# Patient Record
Sex: Male | Born: 1980 | Race: Black or African American | Hispanic: No | Marital: Married | Smoking: Never smoker
Health system: Southern US, Community
[De-identification: ages and names within clinical notes are randomized; demographics above are authoritative.]

---

## 2006-09-18 ENCOUNTER — Emergency Department (HOSPITAL_COMMUNITY): Admission: EM | Admit: 2006-09-18 | Discharge: 2006-09-18 | Payer: Self-pay | Admitting: Emergency Medicine

## 2010-10-15 ENCOUNTER — Inpatient Hospital Stay (INDEPENDENT_AMBULATORY_CARE_PROVIDER_SITE_OTHER)
Admission: RE | Admit: 2010-10-15 | Discharge: 2010-10-15 | Disposition: A | Discharge status: Home / Self Care | Payer: Self-pay | Source: Ambulatory Visit | Attending: Emergency Medicine | Admitting: Emergency Medicine

## 2010-10-15 DIAGNOSIS — L539 Erythematous condition, unspecified: Secondary | ICD-10-CM

## 2010-10-15 DIAGNOSIS — L819 Disorder of pigmentation, unspecified: Secondary | ICD-10-CM

## 2010-10-15 LAB — POCT I-STAT, CHEM 8
BUN: 7 mg/dL (ref 6–23)
Chloride: 103 mEq/L (ref 96–112)
Glucose, Bld: 100 mg/dL — ABNORMAL HIGH (ref 70–99)
HCT: 49 % (ref 39.0–52.0)
Potassium: 4.3 mEq/L (ref 3.5–5.1)

## 2010-10-15 LAB — CBC
HCT: 44.4 % (ref 39.0–52.0)
Hemoglobin: 15.2 g/dL (ref 13.0–17.0)
MCHC: 34.2 g/dL (ref 30.0–36.0)
WBC: 7.2 10*3/uL (ref 4.0–10.5)

## 2010-10-15 LAB — SEDIMENTATION RATE: Sed Rate: 3 mm/hr (ref 0–16)

## 2010-12-08 ENCOUNTER — Ambulatory Visit: Payer: Self-pay | Admitting: Family Medicine

## 2010-12-09 ENCOUNTER — Encounter: Payer: Self-pay | Admitting: Family Medicine

## 2010-12-09 ENCOUNTER — Ambulatory Visit (INDEPENDENT_AMBULATORY_CARE_PROVIDER_SITE_OTHER): Payer: 59 | Admitting: Family Medicine

## 2010-12-09 ENCOUNTER — Ambulatory Visit (INDEPENDENT_AMBULATORY_CARE_PROVIDER_SITE_OTHER)
Admission: RE | Admit: 2010-12-09 | Discharge: 2010-12-09 | Disposition: A | Discharge status: Home / Self Care | Payer: 59 | Source: Ambulatory Visit | Attending: Family Medicine | Admitting: Family Medicine

## 2010-12-09 VITALS — BP 110/72 | HR 72 | Temp 98.0°F | Ht 77.0 in | Wt 231.5 lb

## 2010-12-09 DIAGNOSIS — Z0000 Encounter for general adult medical examination without abnormal findings: Secondary | ICD-10-CM

## 2010-12-09 DIAGNOSIS — M545 Low back pain, unspecified: Secondary | ICD-10-CM

## 2010-12-09 DIAGNOSIS — Z136 Encounter for screening for cardiovascular disorders: Secondary | ICD-10-CM

## 2010-12-09 LAB — BASIC METABOLIC PANEL
CO2: 30 mEq/L (ref 19–32)
Chloride: 105 mEq/L (ref 96–112)
Sodium: 141 mEq/L (ref 135–145)

## 2010-12-09 LAB — LIPID PANEL
HDL: 42.4 mg/dL (ref >39.00)
LDL Cholesterol: 117 mg/dL — ABNORMAL HIGH (ref 0–99)
Total CHOL/HDL Ratio: 5
Triglycerides: 187 mg/dL — ABNORMAL HIGH (ref 0.0–149.0)

## 2010-12-09 NOTE — Patient Instructions (Signed)
Wonderful to meet you. Have a great Thanksgiving and trip to [place]. We will call you with your xray and lab results.

## 2010-12-09 NOTE — Progress Notes (Signed)
  Subjective:    Patient ID: Ian Ortega, male    DOB: [birth year], 30 y.o.   MRN: [id number]  HPI Very pleasant 30 yo male here to establish care and for CPX.  Very healthy, no known family h/o DM, HTN or Cancer.  Right lower back pain- On and off for several months. No LE weakness or radiculopathy. Was wrestling with someone larger than him, thought he felt something pop--since then, intermittent back aches.  Nothing seems to make it better or worse.  Patient Active Problem List  Diagnoses  . Right lumbar pain  . Routine general medical examination at a health care facility   No past medical history on file. No past surgical history on file. History  Substance Use Topics  . Smoking status: Never Smoker   . Smokeless tobacco: Not on file  . Alcohol Use: Not on file   No family history on file. No Known Allergies No current outpatient prescriptions on file prior to visit.   The PMH, PSH, Social History, Family History, Medications, and allergies have been reviewed in [facility], and have been updated if relevant.    Review of Systems See HPI Patient reports no  vision/ hearing changes,anorexia, weight change, fever ,adenopathy, persistant / recurrent hoarseness, swallowing issues, chest pain, edema,persistant / recurrent cough, hemoptysis, dyspnea(rest, exertional, paroxysmal nocturnal), gastrointestinal  bleeding (melena, rectal bleeding), abdominal pain, excessive heart burn, GU symptoms(dysuria, hematuria, pyuria, voiding/incontinence  Issues) syncope, focal weakness, severe memory loss, concerning skin lesions, depression, anxiety, abnormal bruising/bleeding, major joint swelling.       Objective:   Physical Exam BP 110/72  Pulse 72  Temp(Src) 98 F (36.7 C) (Oral)  Ht 6\' 5"  (1.956 m)  Wt 231 lb 8 oz (105.008 kg)  BMI 27.45 kg/m2 General:  plesasant male in NAD Eyes:  PERRL Ears:  External ear exam shows no significant lesions or deformities.  Otoscopic examination  reveals clear canals, tympanic membranes are intact bilaterally without bulging, retraction, inflammation or discharge. Hearing is grossly normal bilaterally. Nose:  External nasal examination shows no deformity or inflammation. Nasal mucosa are pink and moist without lesions or exudates. Mouth:  Oral mucosa and oropharynx without lesions or exudates.  Teeth in good repair. Neck:  no carotid bruit or thyromegaly no cervical or supraclavicular lymphadenopathy  Lungs:  Normal respiratory effort, chest expands symmetrically. Lungs are clear to auscultation, no crackles or wheezes. Heart:  Normal rate and regular rhythm. S1 and S2 normal without gallop, murmur, click, rub or other extra sounds. Abdomen:  Bowel sounds positive,abdomen soft and non-tender without masses, organomegaly or hernias noted. Msk:  +curvature to spine, abnormal alignment, SLR neg bilaterally, non tender to palpation over spine, FROM, normal gait. Pulses:  R and L posterior tibial pulses are full and equal bilaterally  Extremities:  no edema      Assessment & Plan:   1. Right lumbar pain  New- ? Abnormal alignment/scoliosis. Will get xrays today, likely will need ortho referral. DG Lumbar Spine Complete W/Bend  2. Routine general medical examination at a health care facility  Reviewed preventive care protocols, scheduled due services, and updated immunizations Discussed nutrition, exercise, diet, and healthy lifestyle.  Basic Metabolic Panel (BMET)  3. Screening for ischemic heart disease  Lipid Profile

## 2010-12-15 ENCOUNTER — Encounter: Payer: Self-pay | Admitting: *Deleted

## 2010-12-15 ENCOUNTER — Other Ambulatory Visit: Payer: Self-pay | Admitting: Family Medicine

## 2010-12-15 DIAGNOSIS — M545 Low back pain, unspecified: Secondary | ICD-10-CM

## 2012-04-27 ENCOUNTER — Encounter: Payer: Self-pay | Admitting: Family Medicine

## 2012-04-27 ENCOUNTER — Ambulatory Visit (INDEPENDENT_AMBULATORY_CARE_PROVIDER_SITE_OTHER): Payer: BC Managed Care – PPO | Admitting: Family Medicine

## 2012-04-27 VITALS — BP 126/82 | HR 66 | Temp 98.0°F | Ht 77.0 in | Wt 238.8 lb

## 2012-04-27 DIAGNOSIS — Z0000 Encounter for general adult medical examination without abnormal findings: Secondary | ICD-10-CM

## 2012-04-27 DIAGNOSIS — R0609 Other forms of dyspnea: Secondary | ICD-10-CM

## 2012-04-27 DIAGNOSIS — R0989 Other specified symptoms and signs involving the circulatory and respiratory systems: Secondary | ICD-10-CM

## 2012-04-27 DIAGNOSIS — R0683 Snoring: Secondary | ICD-10-CM

## 2012-04-27 DIAGNOSIS — Z136 Encounter for screening for cardiovascular disorders: Secondary | ICD-10-CM

## 2012-04-27 LAB — COMPREHENSIVE METABOLIC PANEL
ALT: 20 U/L (ref 0–53)
AST: 20 U/L (ref 0–37)
Creatinine, Ser: 0.9 mg/dL (ref 0.4–1.5)
GFR: 132.62 mL/min (ref >60.00)
Total Bilirubin: 0.5 mg/dL (ref 0.3–1.2)

## 2012-04-27 LAB — LIPID PANEL
HDL: 31.7 mg/dL — ABNORMAL LOW (ref >39.00)
VLDL: 65 mg/dL — ABNORMAL HIGH (ref 0.0–40.0)

## 2012-04-27 LAB — LDL CHOLESTEROL, DIRECT: Direct LDL: 135.4 mg/dL

## 2012-04-27 NOTE — Patient Instructions (Addendum)
We will call you with your lab results. After you go to the lab, please stop by to see Shirlee Limerick to set up your appointment.

## 2012-04-27 NOTE — Progress Notes (Signed)
Very pleasant 32 yo health male here for CPX.  Very healthy, no known family h/o DM, HTN or Cancer.   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 Does not feel more fatigued that usual.  No CP, SOB.  Patient Active Problem List  Diagnosis  . Routine general medical examination at a health care facility   No past medical history on file. No past surgical history on file. History  Substance Use Topics  . Smoking status: Never Smoker   . Smokeless tobacco: Not on file  . Alcohol Use: Yes     Comment: occasional   No family history on file. No Known Allergies No current outpatient prescriptions on file prior to visit.   No current facility-administered medications on file prior to visit.   The PMH, PSH, Social History, Family History, Medications, and allergies have been reviewed in [facility], and have been updated if relevant.  Review of Systems  See HPI  Patient reports no vision/ hearing changes,anorexia, weight change, fever ,adenopathy, persistant / recurrent hoarseness, swallowing issues, chest pain, edema,persistant / recurrent cough, hemoptysis, dyspnea(rest, exertional, paroxysmal nocturnal), gastrointestinal bleeding (melena, rectal bleeding), abdominal pain, excessive heart burn, GU symptoms(dysuria, hematuria, pyuria, voiding/incontinence Issues) syncope, focal weakness, severe memory loss, concerning skin lesions, depression, anxiety, abnormal bruising/bleeding, major joint swelling.  Objective:   Physical Exam  BP 126/82  Pulse 66  Temp(Src) 98 F (36.7 C) (Oral)  Ht 6\' 5"  (1.956 m)  Wt 238 lb 12 oz (108.296 kg)  BMI 28.31 kg/m2  SpO2 98%  General: plesasant male in NAD  Eyes: PERRL  Ears: External ear exam shows no significant lesions or deformities. Otoscopic examination reveals clear canals, tympanic membranes are intact bilaterally without  bulging, retraction, inflammation or discharge. Hearing is grossly normal bilaterally.  Nose: External nasal examination shows no deformity or inflammation. Nasal mucosa are pink and moist without lesions or exudates.  Mouth: Oral mucosa and oropharynx without lesions or exudates. Teeth in good repair.  Neck: no carotid bruit or thyromegaly no cervical or supraclavicular lymphadenopathy  Lungs: Normal respiratory effort, chest expands symmetrically. Lungs are clear to auscultation, no crackles or wheezes.  Heart: Normal rate and regular rhythm. S1 and S2 normal without gallop, murmur, click, rub or other extra sounds.  Abdomen: Bowel sounds positive,abdomen soft and non-tender without masses, organomegaly or hernias noted.  Pulses: R and L posterior tibial pulses are full and equal bilaterally  Extremities: no edema   Assessment and Plan: 1. Routine general medical examination at a health care facility Reviewed preventive care protocols, scheduled due services, and updated immunizations Discussed nutrition, exercise, diet, and healthy lifestyle.  - Comprehensive metabolic panel  2. Screening for ischemic heart disease  - Lipid Panel  3. Snoring New- refer to pulmonary for sleep study. The patient indicates understanding of these issues and agrees with the plan.  - Ambulatory referral to Pulmonology

## 2012-05-03 ENCOUNTER — Encounter: Payer: Self-pay | Admitting: *Deleted

## 2012-05-27 ENCOUNTER — Ambulatory Visit (INDEPENDENT_AMBULATORY_CARE_PROVIDER_SITE_OTHER): Payer: BC Managed Care – PPO | Admitting: Pulmonary Disease

## 2012-05-27 ENCOUNTER — Encounter: Payer: Self-pay | Admitting: Pulmonary Disease

## 2012-05-27 VITALS — BP 122/76 | HR 77 | Temp 97.7°F | Ht 77.0 in | Wt 245.0 lb

## 2012-05-27 DIAGNOSIS — G4733 Obstructive sleep apnea (adult) (pediatric): Secondary | ICD-10-CM

## 2012-05-27 NOTE — Progress Notes (Signed)
  Subjective:    Patient ID: Ian Ortega, male    DOB: 06/16/1980, 32 y.o.   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 Pt started working 3rd shift in [place]. Spouse notices this about a month ago. Per pt he hasn't noticed this bc he works and has 2 kids and doesn't sleep much.  Epworth sleepiness score is 3/24. Bedtime on a week is at 8:30 AM, he wakes up and down to pick his bed is up from school than naps again for 2 hours late evening before going into work. He sleeps at night on the weekends when he is off. He sleeps on his side with 2 pillows. He denies awakenings during sleep. He is not gained weight in recent years There is no history suggestive of cataplexy, sleep paralysis or parasomnias   No past medical history on file.  No past surgical history on file.   No Known Allergies   History   Social History  . Marital Status: Married    Spouse Name: Rodney Booze    Number of Children: 2  . Years of Education: N/A   Occupational History  .  [employer]   Social History Main Topics  . Smoking status: Never Smoker   . Smokeless tobacco: Not on file  . Alcohol Use: Yes     Comment: occasional  . Drug Use: No  . Sexually Active: Not on file   Other Topics Concern  . Not on file   Social History Narrative   Works at [employer].   Married to [employer].    No family history on file.   Review of Systems  Constitutional: Negative for appetite change and unexpected weight change.  HENT: Negative for ear pain, congestion, sore throat, sneezing, trouble swallowing and dental problem.   Respiratory: Negative for cough.   Cardiovascular: Negative for chest pain, palpitations and leg swelling.  Gastrointestinal: Negative for abdominal pain.  Musculoskeletal: Negative for joint swelling.  Skin: Negative for rash.  Neurological: Negative for headaches.   Psychiatric/Behavioral: Negative for dysphoric mood. The patient is not nervous/anxious.        Objective:   Physical Exam  Gen. Pleasant, well-nourished, tall,  in no distress, normal affect ENT - no lesions, no post nasal drip Neck: No JVD, no thyromegaly, no carotid bruits Lungs: no use of accessory muscles, no dullness to percussion, clear without rales or rhonchi  Cardiovascular: Rhythm regular, heart sounds  normal, no murmurs or gallops, no peripheral edema Abdomen: soft and non-tender, no hepatosplenomegaly, BS normal. Musculoskeletal: No deformities, no cyanosis or clubbing Neuro:  alert, non focal       Assessment & Plan:

## 2012-05-27 NOTE — Assessment & Plan Note (Addendum)
Given excessive daytime somnolence, narrow pharyngeal exam, witnessed apneas & loud snoring, obstructive sleep apnea is very likely & an overnight polysomnogram will be scheduled as a split study. The pathophysiology of obstructive sleep apnea , it's cardiovascular consequences & modes of treatment including CPAP were discused with the patient in detail & they evidenced understanding. He would prefer a home study & he meets criteria since he does not have comorbidities

## 2012-05-27 NOTE — Patient Instructions (Signed)
Sleep study will be scheduled 

## 2012-06-15 ENCOUNTER — Telehealth: Payer: Self-pay | Admitting: Pulmonary Disease

## 2012-06-15 DIAGNOSIS — G4733 Obstructive sleep apnea (adult) (pediatric): Secondary | ICD-10-CM

## 2012-06-15 NOTE — Telephone Encounter (Signed)
Message from our [facility] will send to RA to give okay to place order.

## 2012-06-15 NOTE — Telephone Encounter (Signed)
Home study refused Ordered PSG -daytime

## 2012-06-22 ENCOUNTER — Encounter (HOSPITAL_BASED_OUTPATIENT_CLINIC_OR_DEPARTMENT_OTHER): Payer: BC Managed Care – PPO

## 2012-08-03 ENCOUNTER — Other Ambulatory Visit: Payer: BC Managed Care – PPO

## 2012-09-27 IMAGING — CR DG LUMBAR SPINE COMPLETE W/ BEND
2 series · 2 of 2 positions shown · non-contrast
Comparison: None.

CLINICAL DATA: Back pain.

LUMBAR SPINE - COMPLETE WITH BENDING VIEWS

[view not recorded (1 of 2)]
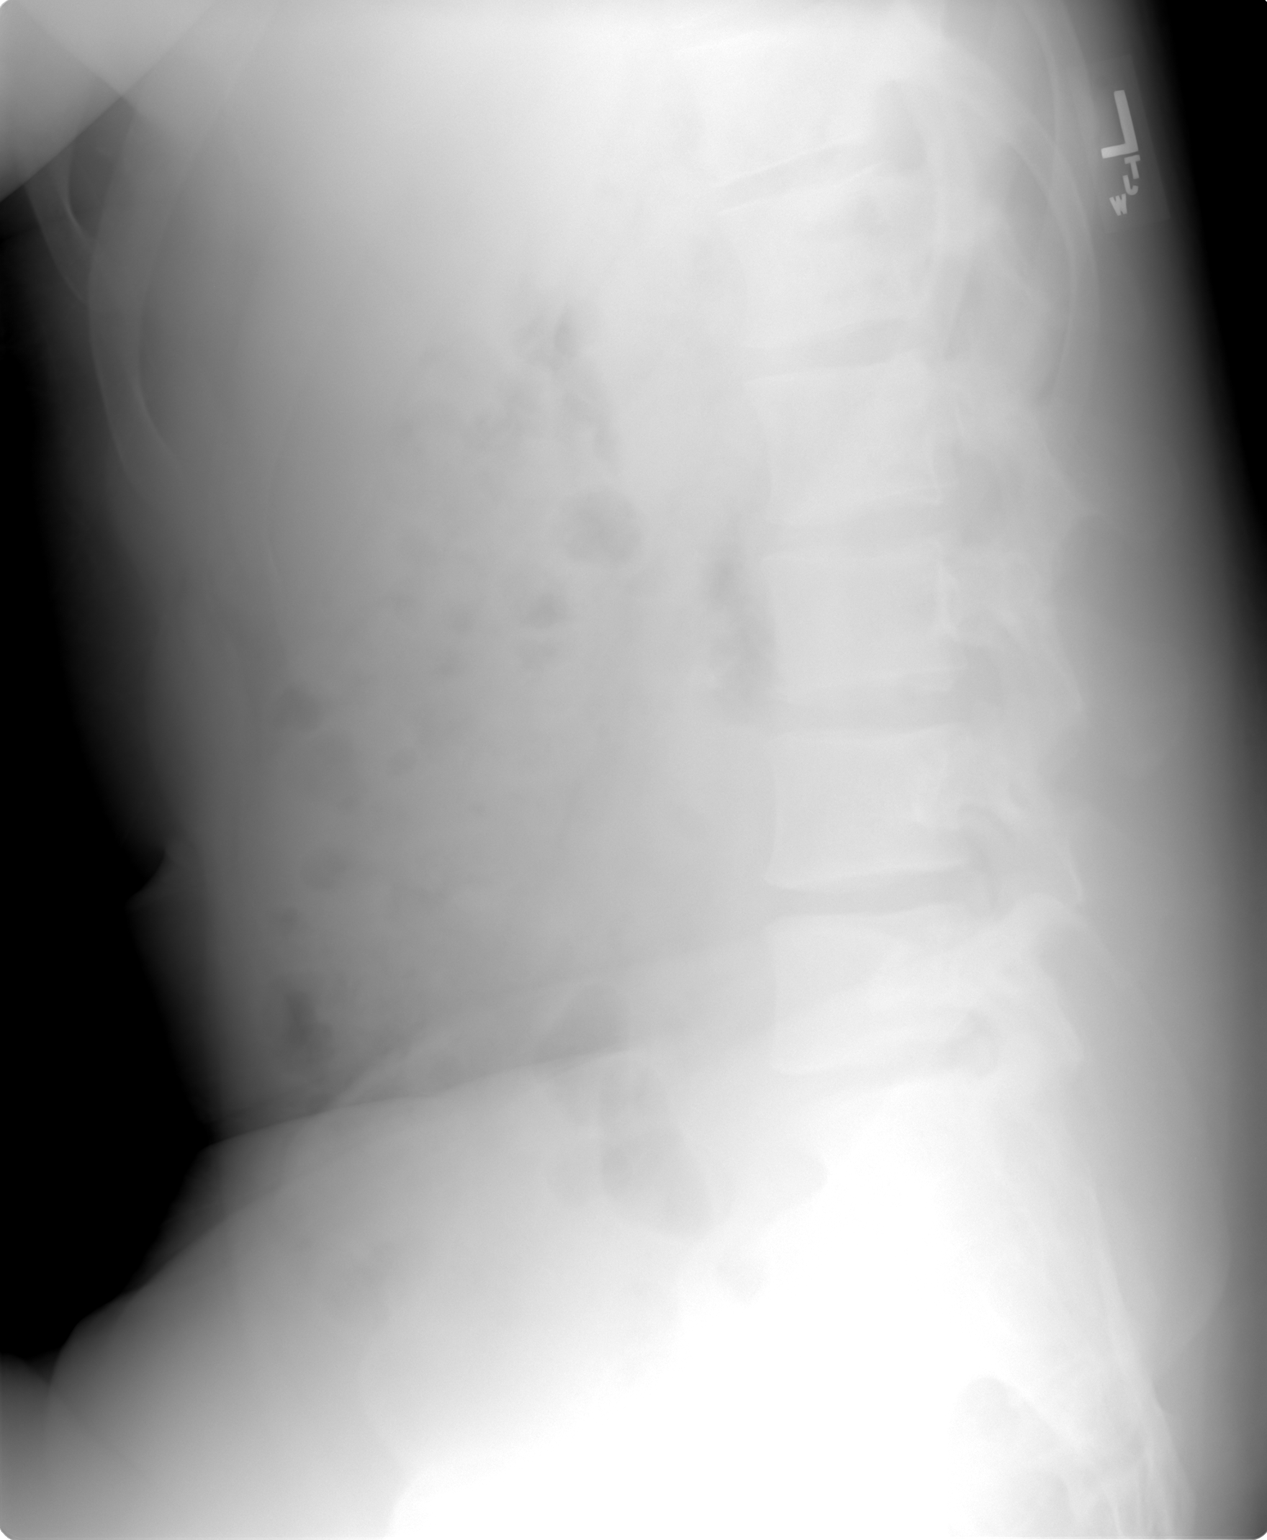

[view not recorded (2 of 2)]
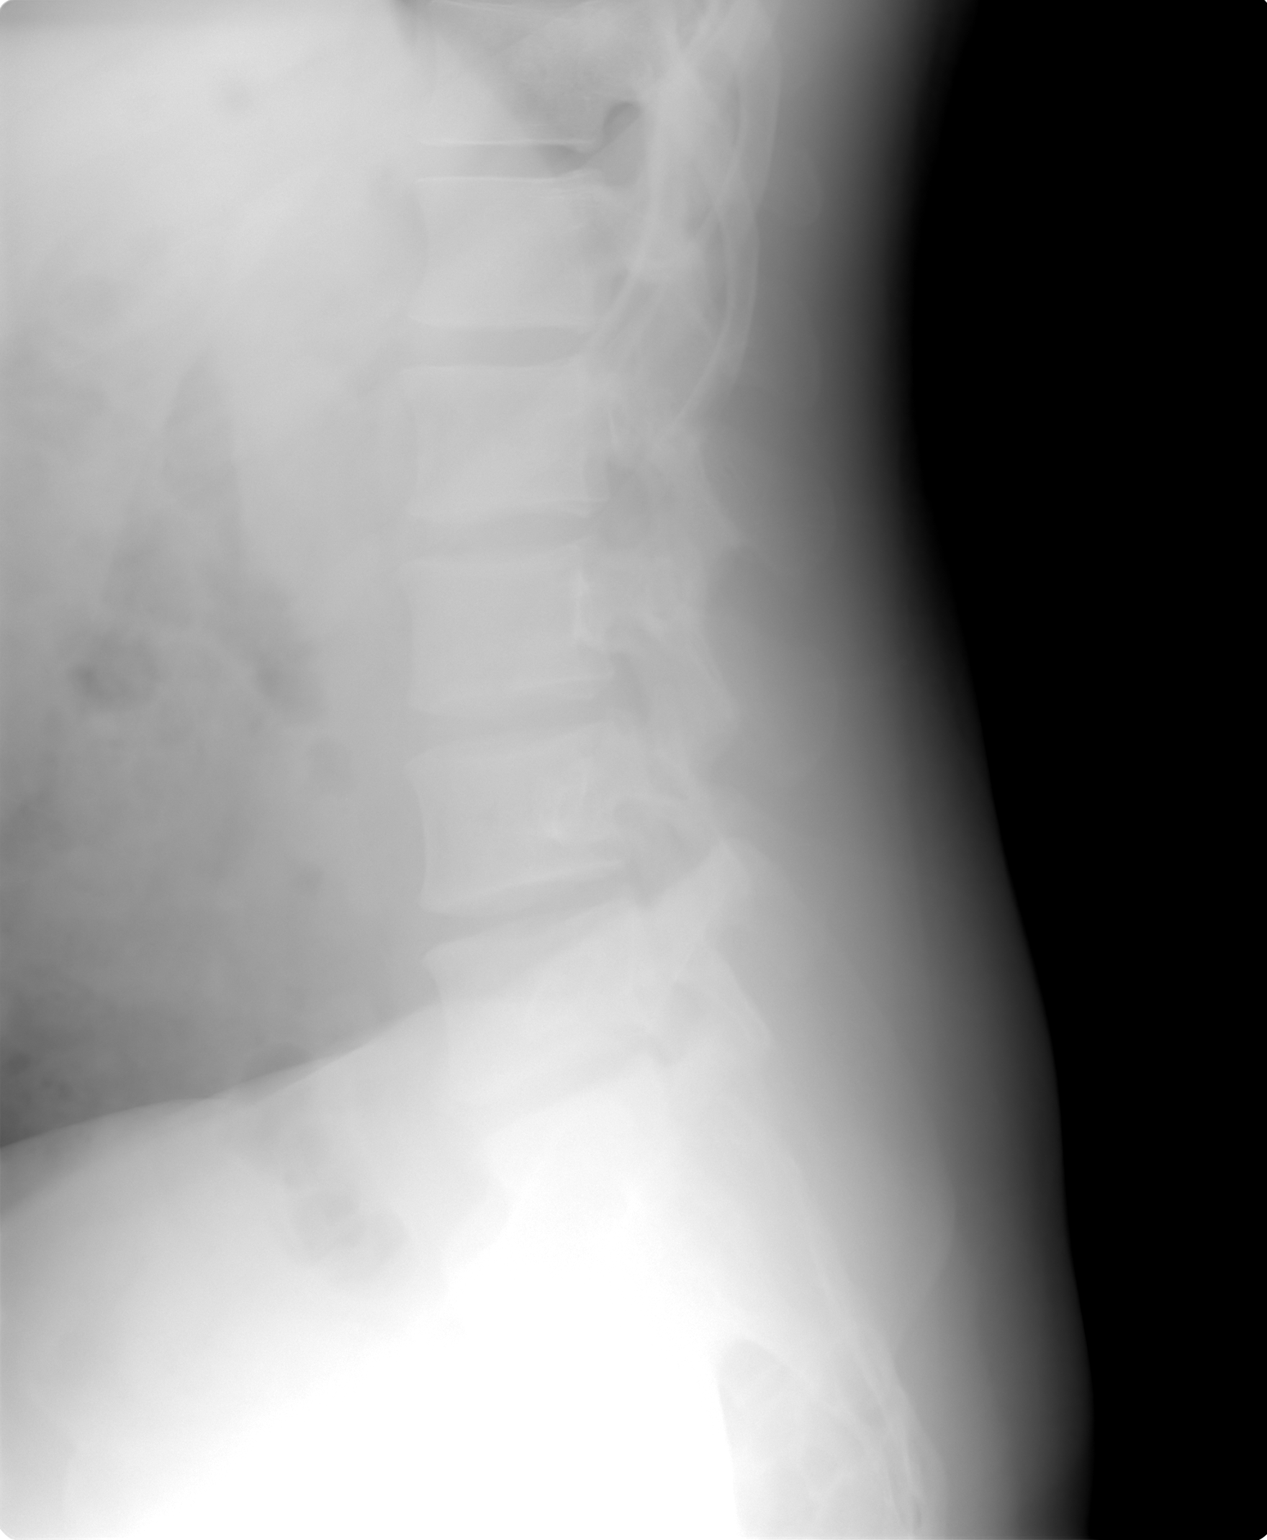

[2 of 2 positions shown; findings below may reference images not displayed]

FINDINGS: Straightening of the lumbar spine.  Mild L4-5 and L5-S1
disc space narrowing.
IMPRESSION: Straightening of the lumbar spine.  Mild L4-5 and L5-S1 disc space
narrowing.

## 2012-11-30 ENCOUNTER — Encounter: Payer: Self-pay | Admitting: Family Medicine

## 2012-11-30 ENCOUNTER — Ambulatory Visit (INDEPENDENT_AMBULATORY_CARE_PROVIDER_SITE_OTHER): Payer: BC Managed Care – PPO | Admitting: Family Medicine

## 2012-11-30 VITALS — BP 124/78 | HR 75 | Temp 97.2°F | Ht 77.0 in | Wt 247.8 lb

## 2012-11-30 DIAGNOSIS — M545 Low back pain: Secondary | ICD-10-CM

## 2012-11-30 NOTE — Progress Notes (Signed)
Pre-visit discussion using our clinic review tool. No additional management support is needed unless otherwise documented below in the visit note.  

## 2012-11-30 NOTE — Progress Notes (Signed)
   Patient Name: Ian Ortega Date of Birth: [birth year] Medical Record Number: [id number] Gender: male  PCP: Ruthe Mannan, MD  History of Present Illness:  Ian Ortega is a 32 y.o. very pleasant male patient who presents with the following: Back Pain  ongoing for approximately: 1 week The patient has had back pain before. The back pain is localized into the lumbar spine area. They also describe no radiculopathy. Was down at [facility], and when he picked him up and felt a sharp pain in the side of his back. And was contorted to his left side.   Years ago, he saw Dr. Jonnie Finner, on lower back.   No numbness or tingling. No bowel or bladder incontinence. No focal weakness. Prior interventions: tylenol Physical therapy: No Chiropractic manipulations: in past Acupuncture: No Osteopathic manipulation: No Heat or cold: heat made it feel better  Past Medical History, Surgical History, Family History, Medications, Allergies have been reviewed and updated if relevant.  Review of Systems  GEN: No fevers, chills. Nontoxic. Primarily MSK c/o today. MSK: Detailed in the HPI GI: tolerating PO intake without difficulty Neuro: As above  Otherwise the pertinent positives of the ROS are noted above.    Physical Exam  Filed Vitals:   11/30/12 0904  BP: 124/78  Pulse: 75  Temp: 97.2 F (36.2 C)  TempSrc: Oral  Height: 6\' 5"  (1.956 m)  Weight: 247 lb 12 oz (112.379 kg)  SpO2: 98%    Gen: Well-developed,well-nourished,in no acute distress; alert,appropriate and cooperative throughout examination HEENT: Normocephalic and atraumatic without obvious abnormalities.  Ears, externally no deformities Pulm: Breathing comfortably in no respiratory distress Range of motion at  the waist: Flexion, rotation and lateral bending: flexion to 110, all other wnl  No echymosis or edema Rises to examination table with no difficulty Gait: minimally antalgic  Inspection/Deformity: No  abnormality Paraspinus T:  l3-s1 in muscle  B Ankle Dorsiflexion (L5,4): 5/5 B Great Toe Dorsiflexion (L5,4): 5/5 Heel Walk (L5): WNL Toe Walk (S1): WNL Rise/Squat (L4): WNL, mild pain  SENSORY B Medial Foot (L4): WNL B Dorsum (L5): WNL B Lateral (S1): WNL Light Touch: WNL Pinprick: WNL  REFLEXES Knee (L4): 2+ Ankle (S1): 2+  B SLR, seated: neg B SLR, supine: neg B FABER: neg B Reverse FABER: neg B Greater Troch: NT B Log Roll: neg B Stork: NT B Sciatic Notch: NT   Assessment and Plan:  Back pain  >25 minutes spent in face to face time with patient, >50% spent in counselling or coordination of care  I reviewed with the patient the structures involved and how they related to diagnosis.  Start with medications, core rehab, and progress from there following low back pain algorithm. No red flags are present.  Rehab from [facility],  Karleen Hampshire T. Audree Schrecengost, MD, CAQ Sports Medicine  [facility] [address] [place] [address] Phone: [phone] Fax: [phone]  Updated Complete Medication List:   Medication List       This list is accurate as of: 11/30/12  9:38 AM.  Always use your most recent med list.               MULTIVITAMIN PO  Take 1 tablet by mouth daily.

## 2013-04-27 ENCOUNTER — Ambulatory Visit (INDEPENDENT_AMBULATORY_CARE_PROVIDER_SITE_OTHER): Payer: BC Managed Care – PPO | Admitting: Family Medicine

## 2013-04-27 ENCOUNTER — Encounter: Payer: Self-pay | Admitting: Family Medicine

## 2013-04-27 VITALS — BP 142/82 | HR 73 | Temp 97.9°F | Ht 75.5 in | Wt 253.5 lb

## 2013-04-27 DIAGNOSIS — E785 Hyperlipidemia, unspecified: Secondary | ICD-10-CM

## 2013-04-27 DIAGNOSIS — G4733 Obstructive sleep apnea (adult) (pediatric): Secondary | ICD-10-CM

## 2013-04-27 DIAGNOSIS — Z0000 Encounter for general adult medical examination without abnormal findings: Secondary | ICD-10-CM

## 2013-04-27 MED ORDER — SIMVASTATIN 10 MG PO TABS: 10.0000 mg | ORAL_TABLET | Freq: Every day | ORAL | Status: DC

## 2013-04-27 NOTE — Progress Notes (Signed)
   Subjective:   Patient ID: Ian GaribaldiMarlo Duran Shad, male    DOB: 08/03/1980, 33 y.o.   MRN: [id number]  Ian Ortega is a pleasant 33 y.o. year old male who presents to clinic today with Hyperlipidemia  on 04/27/2013  HPI: Had labs done at work- Total cholesterol 271 HDL 44 LDL 160 TG 335  Admits to being less active.  He has gained some weight. Wt Readings from Last 3 Encounters:  04/27/13 253 lb 8 oz (114.987 kg)  11/30/12 247 lb 12 oz (112.379 kg)  05/27/12 245 lb (111.131 kg)   Overall, he eats very healthy.  Eats more sugar than he should but wife cooks- usually bakes food.  He does like sweets and she bakes cakes as well.  No known FH of HLD.  Denies CP or SOB.  CBG 74.  Patient Active Problem List   Diagnosis Date Noted  . HLD (hyperlipidemia) 04/27/2013  . OSA (obstructive sleep apnea) 05/27/2012  . Routine general medical examination at a health care facility 12/09/2010   No past medical history on file. No past surgical history on file. History  Substance Use Topics  . Smoking status: Never Smoker   . Smokeless tobacco: Not on file  . Alcohol Use: Yes     Comment: occasional   No family history on file. No Known Allergies Current Outpatient Prescriptions on File Prior to Visit  Medication Sig Dispense Refill  . Multiple Vitamins-Minerals (MULTIVITAMIN PO) Take 1 tablet by mouth daily.       No current facility-administered medications on file prior to visit.   The PMH, PSH, Social History, Family History, Medications, and allergies [facility], and have been updated if relevant.   Review of Systems See HPI No muscle aches    Objective:    BP 142/82  Pulse 73  Temp(Src) 97.9 F (36.6 C) (Oral)  Ht 6' 3.5" (1.918 m)  Wt 253 lb 8 oz (114.987 kg)  BMI 31.26 kg/m2  SpO2 98%   Physical Exam  Nursing note and vitals reviewed. Constitutional: He is oriented to person, place, and time. He appears well-developed and well-nourished. No  distress.  HENT:  Head: Normocephalic.  Musculoskeletal: Normal range of motion.  Neurological: He is alert and oriented to person, place, and time.  Skin: Skin is warm.  Psychiatric: He has a normal mood and affect. His behavior is normal. Judgment and thought content normal.          Assessment & Plan:   HLD (hyperlipidemia)  Routine general medical examination at a health care facility  OSA (obstructive sleep apnea) Return in about 2 months (around 06/27/2013).

## 2013-04-27 NOTE — Patient Instructions (Signed)
Good to see you. Let's start simvastatin 10 mg daily. Come see me in 2 months.  Increase your physical activity as we discussed.

## 2013-04-27 NOTE — Assessment & Plan Note (Signed)
New- discussed low cholesterol diet and handout given discussing dietary changes.  Advised increasing physical activity, cutting back on sugar (CBG from lab work normal).  Simvastatin 10 mg daily.  Follow up in 8 weeks for labs and appointment.  The patient indicates understanding of these issues and agrees with the plan.

## 2013-04-27 NOTE — Progress Notes (Signed)
Pre visit review using our clinic review tool, if applicable. No additional management support is needed unless otherwise documented below in the visit note. 

## 2013-06-27 ENCOUNTER — Encounter: Payer: Self-pay | Admitting: Family Medicine

## 2013-06-27 ENCOUNTER — Ambulatory Visit (INDEPENDENT_AMBULATORY_CARE_PROVIDER_SITE_OTHER): Payer: BC Managed Care – PPO | Admitting: Family Medicine

## 2013-06-27 VITALS — BP 130/76 | HR 74 | Temp 98.2°F | Wt 253.0 lb

## 2013-06-27 DIAGNOSIS — E785 Hyperlipidemia, unspecified: Secondary | ICD-10-CM

## 2013-06-27 LAB — COMPREHENSIVE METABOLIC PANEL
ALT: 21 U/L (ref 0–53)
AST: 27 U/L (ref 0–37)
Albumin: 4.1 g/dL (ref 3.5–5.2)
Alkaline Phosphatase: 46 U/L (ref 39–117)
BILIRUBIN TOTAL: 0.6 mg/dL (ref 0.2–1.2)
BUN: 10 mg/dL (ref 6–23)
CALCIUM: 9.6 mg/dL (ref 8.4–10.5)
CHLORIDE: 102 meq/L (ref 96–112)
CO2: 30 meq/L (ref 19–32)
CREATININE: 1 mg/dL (ref 0.4–1.5)
GFR: 107.94 mL/min (ref >60.00)
Glucose, Bld: 104 mg/dL — ABNORMAL HIGH (ref 70–99)
Potassium: 4.7 mEq/L (ref 3.5–5.1)
Sodium: 138 mEq/L (ref 135–145)
Total Protein: 7.4 g/dL (ref 6.0–8.3)

## 2013-06-27 LAB — LIPID PANEL
CHOL/HDL RATIO: 6
Cholesterol: 216 mg/dL — ABNORMAL HIGH (ref 0–200)
HDL: 35 mg/dL — AB (ref >39.00)
LDL Cholesterol: 118 mg/dL — ABNORMAL HIGH (ref 0–99)
NONHDL: 181
TRIGLYCERIDES: 314 mg/dL — AB (ref 0.0–149.0)
VLDL: 62.8 mg/dL — AB (ref 0.0–40.0)

## 2013-06-27 NOTE — Patient Instructions (Addendum)
Good to see you. We will call you with your lab results.   

## 2013-06-27 NOTE — Progress Notes (Signed)
   Subjective:   Patient ID: Ian Ortega, male    DOB: [birth year], 33 y.o.   MRN: [id number]  [name] is a pleasant 33 y.o. year old male who presents to clinic today with Follow-up  on 06/27/2013  HPI: HLD- here for follow up of this. LDL in 04/2013 160 (labs done at work- scanned into [employer]).  No known FH of HLD.  Had admitted to eating sweets and baked goods.  Started simvastatin 10 mg daily.  Denies myalgias.  Changed diet- eating more salads, more active.   Current Outpatient Prescriptions on File Prior to Visit  Medication Sig Dispense Refill  . Multiple Vitamins-Minerals (MULTIVITAMIN PO) Take 1 tablet by mouth daily.      . simvastatin (ZOCOR) 10 MG tablet Take 1 tablet (10 mg total) by mouth at bedtime.  90 tablet  3   No current facility-administered medications on file prior to visit.    No Known Allergies  No past medical history on file.  No past surgical history on file.  No family history on file.  History   Social History  . Marital Status: Married    Spouse Name: Rodney Booze    Number of Children: 2  . Years of Education: N/A   Occupational History  .  [employer]   Social History Main Topics  . Smoking status: Never Smoker   . Smokeless tobacco: Not on file  . Alcohol Use: Yes     Comment: occasional  . Drug Use: No  . Sexual Activity: Not on file   Other Topics Concern  . Not on file   Social History Narrative   Works at [employer].   Married to [employer].   The PMH, PSH, Social History, Family History, Medications, and allergies have been reviewed in [facility], and have been updated if relevant.     Review of Systems    See HPI Objective:    BP 130/76  Pulse 74  Temp(Src) 98.2 F (36.8 C) (Oral)  Wt 253 lb (114.76 kg)  SpO2 97% Wt Readings from Last 3 Encounters:  06/27/13 253 lb (114.76 kg)  04/27/13 253 lb 8 oz (114.987 kg)  11/30/12 247 lb 12 oz (112.379 kg)     Physical Exam  Nursing note and vitals  reviewed. Constitutional: He appears well-developed and well-nourished. No distress.  Cardiovascular: Normal rate and regular rhythm.   Pulmonary/Chest: Effort normal and breath sounds normal.  Abdominal: Soft. Bowel sounds are normal.  Psychiatric: He has a normal mood and affect. His behavior is normal. Judgment and thought content normal.          Assessment & Plan:   HLD (hyperlipidemia) No Follow-up on file.

## 2013-06-27 NOTE — Assessment & Plan Note (Signed)
Continue simvastatin 10 mg daily. Check labs today. Continue low cholesterol diet.  Orders Placed This Encounter  Procedures  . Lipid Panel  . Comprehensive metabolic panel

## 2013-06-27 NOTE — Progress Notes (Signed)
Pre visit review using our clinic review tool, if applicable. No additional management support is needed unless otherwise documented below in the visit note. 

## 2013-07-04 ENCOUNTER — Ambulatory Visit (INDEPENDENT_AMBULATORY_CARE_PROVIDER_SITE_OTHER): Payer: BC Managed Care – PPO | Admitting: Internal Medicine

## 2013-07-04 ENCOUNTER — Encounter: Payer: Self-pay | Admitting: Internal Medicine

## 2013-07-04 VITALS — BP 120/68 | HR 109 | Temp 98.6°F | Wt 251.0 lb

## 2013-07-04 DIAGNOSIS — M5380 Other specified dorsopathies, site unspecified: Secondary | ICD-10-CM

## 2013-07-04 DIAGNOSIS — M545 Low back pain, unspecified: Secondary | ICD-10-CM

## 2013-07-04 DIAGNOSIS — M62830 Muscle spasm of back: Secondary | ICD-10-CM

## 2013-07-04 MED ORDER — CYCLOBENZAPRINE HCL 5 MG PO TABS: 5.0000 mg | ORAL_TABLET | Freq: Three times a day (TID) | ORAL | Status: DC | PRN

## 2013-07-04 NOTE — Progress Notes (Signed)
Pre visit review using our clinic review tool, if applicable. No additional management support is needed unless otherwise documented below in the visit note. 

## 2013-07-04 NOTE — Progress Notes (Signed)
Subjective:    Patient ID: Ian Ortega, male    DOB: 03/10/1980, 33 y.o.   MRN: [id number]  HPI  Pt presents to the clinic today with c/o low back pain. This started yesterday. It did occur this morning while he was holding his son. He describes it as a constant, sharp pain with movement. He denies any specific injury to the area. The pain does not radiate down his legs. He denies loss of bowel or bladder. He has tried 800 mg Ibuprofen without much relief. He has seen Dr. Patsy Lageropland for similar complaint in the past.  Review of Systems  History reviewed. No pertinent past medical history.  Current Outpatient Prescriptions  Medication Sig Dispense Refill  . Multiple Vitamins-Minerals (MULTIVITAMIN PO) Take 1 tablet by mouth daily.      . simvastatin (ZOCOR) 10 MG tablet Take 1 tablet (10 mg total) by mouth at bedtime.  90 tablet  3   No current facility-administered medications for this visit.    No Known Allergies  History reviewed. No pertinent family history.  History   Social History  . Marital Status: Married    Spouse Name: Ian Ortega    Number of Children: 2  . Years of Education: N/A   Occupational History  .  [employer]   Social History Main Topics  . Smoking status: Never Smoker   . Smokeless tobacco: Not on file  . Alcohol Use: Yes     Comment: occasional  . Drug Use: No  . Sexual Activity: Not on file   Other Topics Concern  . Not on file   Social History Narrative   Works at [employer].   Married to Home DepotShonetta Ortega.     Constitutional: Denies fever, malaise, fatigue, headache or abrupt weight changes.  Musculoskeletal: Pt reports low back pain. Denies decrease in range of motion, difficulty with gait, or joint pain and swelling.   Neurological: Denies problems with balance and coordination.   No other specific complaints in a complete review of systems (except as listed in HPI above).     Objective:   Physical Exam   BP 120/68  Pulse  109  Temp(Src) 98.6 F (37 C) (Oral)  Wt 251 lb (113.853 kg)  SpO2 98% Wt Readings from Last 3 Encounters:  07/04/13 251 lb (113.853 kg)  06/27/13 253 lb (114.76 kg)  04/27/13 253 lb 8 oz (114.987 kg)    General: Appears his stated age, well developed, well nourished in NAD. Cardiovascular: Tachycardic with normal rhythm. S1,S2 noted.  No murmur, rubs or gallops noted. No JVD or BLE edema. No carotid bruits noted. Pulmonary/Chest: Normal effort and positive vesicular breath sounds. No respiratory distress. No wheezes, rales or ronchi noted.  Musculoskeletal: Normal flexion, extension and lateral bending of the spine. Negative straight leg raise. Normal gait.  Neurological: Alert and oriented. Coordination normal. +DTRs bilaterally.   BMET    Component Value Date/Time   NA 138 06/27/2013 0743   K 4.7 06/27/2013 0743   CL 102 06/27/2013 0743   CO2 30 06/27/2013 0743   GLUCOSE 104* 06/27/2013 0743   BUN 10 06/27/2013 0743   CREATININE 1.0 06/27/2013 0743   CALCIUM 9.6 06/27/2013 0743    Lipid Panel     Component Value Date/Time   CHOL 216* 06/27/2013 0743   TRIG 314.0* 06/27/2013 0743   HDL 35.00* 06/27/2013 0743   CHOLHDL 6 06/27/2013 0743   VLDL 62.8* 06/27/2013 0743   LDLCALC 118* 06/27/2013 [id number]  CBC    Component Value Date/Time   WBC 7.2 10/15/2010 0902   RBC 5.35 10/15/2010 0902   HGB 16.7 10/15/2010 0915   HCT 49.0 10/15/2010 0915   PLT 258 10/15/2010 0902   MCV 83.0 10/15/2010 0902   MCH 28.4 10/15/2010 0902   MCHC 34.2 10/15/2010 0902   RDW 13.9 10/15/2010 0902    Hgb A1C No results found for this basename: HGBA1C        Assessment & Plan:   Muscle spasms of lower back:  eRx for Flexeril 5 mg TID prn as needed Also emphasized back exercises Continue Ibuprofen for inflammation r/t spasms Heating pad also may be useful Work note provided to return on Thursday  RTC as needed or if symptoms persist or worsen

## 2013-07-04 NOTE — Patient Instructions (Addendum)
Back Exercises These exercises may help you when beginning to rehabilitate your injury. Your symptoms may resolve with or without further involvement from your physician, physical therapist or athletic trainer. While completing these exercises, remember:   Restoring tissue flexibility helps normal motion to return to the joints. This allows healthier, less painful movement and activity.  An effective stretch should be held for at least 30 seconds.  A stretch should never be painful. You should only feel a gentle lengthening or release in the stretched tissue. STRETCH  Extension, Prone on Elbows   Lie on your stomach on the floor, a bed will be too soft. Place your palms about shoulder width apart and at the height of your head.  Place your elbows under your shoulders. If this is too painful, stack pillows under your chest.  Allow your body to relax so that your hips drop lower and make contact more completely with the floor.  Hold this position for __________ seconds.  Slowly return to lying flat on the floor. Repeat __________ times. Complete this exercise __________ times per day.  RANGE OF MOTION  Extension, Prone Press Ups   Lie on your stomach on the floor, a bed will be too soft. Place your palms about shoulder width apart and at the height of your head.  Keeping your back as relaxed as possible, slowly straighten your elbows while keeping your hips on the floor. You may adjust the placement of your hands to maximize your comfort. As you gain motion, your hands will come more underneath your shoulders.  Hold this position __________ seconds.  Slowly return to lying flat on the floor. Repeat __________ times. Complete this exercise __________ times per day.  RANGE OF MOTION- Quadruped, Neutral Spine   Assume a hands and knees position on a firm surface. Keep your hands under your shoulders and your knees under your hips. You may place padding under your knees for comfort.  Drop  your head and point your tail bone toward the ground below you. This will round out your low back like an angry cat. Hold this position for __________ seconds.  Slowly lift your head and release your tail bone so that your back sags into a large arch, like an old horse.  Hold this position for __________ seconds.  Repeat this until you feel limber in your low back.  Now, find your "sweet spot." This will be the most comfortable position somewhere between the two previous positions. This is your neutral spine. Once you have found this position, tense your stomach muscles to support your low back.  Hold this position for __________ seconds. Repeat __________ times. Complete this exercise __________ times per day.  STRETCH  Flexion, Single Knee to Chest   Lie on a firm bed or floor with both legs extended in front of you.  Keeping one leg in contact with the floor, bring your opposite knee to your chest. Hold your leg in place by either grabbing behind your thigh or at your knee.  Pull until you feel a gentle stretch in your low back. Hold __________ seconds.  Slowly release your grasp and repeat the exercise with the opposite side. Repeat __________ times. Complete this exercise __________ times per day.  STRETCH - Hamstrings, Standing  Stand or sit and extend your right / left leg, placing your foot on a chair or foot stool  Keeping a slight arch in your low back and your hips straight forward.  Lead with your chest and   lean forward at the waist until you feel a gentle stretch in the back of your right / left knee or thigh. (When done correctly, this exercise requires leaning only a small distance.)  Hold this position for __________ seconds. Repeat __________ times. Complete this stretch __________ times per day. STRENGTHENING  Deep Abdominals, Pelvic Tilt   Lie on a firm bed or floor. Keeping your legs in front of you, bend your knees so they are both pointed toward the ceiling and  your feet are flat on the floor.  Tense your lower abdominal muscles to press your low back into the floor. This motion will rotate your pelvis so that your tail bone is scooping upwards rather than pointing at your feet or into the floor.  With a gentle tension and even breathing, hold this position for __________ seconds. Repeat __________ times. Complete this exercise __________ times per day.  STRENGTHENING  Abdominals, Crunches   Lie on a firm bed or floor. Keeping your legs in front of you, bend your knees so they are both pointed toward the ceiling and your feet are flat on the floor. Cross your arms over your chest.  Slightly tip your chin down without bending your neck.  Tense your abdominals and slowly lift your trunk high enough to just clear your shoulder blades. Lifting higher can put excessive stress on the low back and does not further strengthen your abdominal muscles.  Control your return to the starting position. Repeat __________ times. Complete this exercise __________ times per day.  STRENGTHENING  Quadruped, Opposite UE/LE Lift   Assume a hands and knees position on a firm surface. Keep your hands under your shoulders and your knees under your hips. You may place padding under your knees for comfort.  Find your neutral spine and gently tense your abdominal muscles so that you can maintain this position. Your shoulders and hips should form a rectangle that is parallel with the floor and is not twisted.  Keeping your trunk steady, lift your right hand no higher than your shoulder and then your left leg no higher than your hip. Make sure you are not holding your breath. Hold this position __________ seconds.  Continuing to keep your abdominal muscles tense and your back steady, slowly return to your starting position. Repeat with the opposite arm and leg. Repeat __________ times. Complete this exercise __________ times per day. Document Released: 01/23/2005 Document  Revised: 03/30/2011 Document Reviewed: 04/19/2008 [employer] Patient Information 2014 [employer].  

## 2013-09-26 ENCOUNTER — Ambulatory Visit (INDEPENDENT_AMBULATORY_CARE_PROVIDER_SITE_OTHER): Payer: BC Managed Care – PPO | Admitting: Family Medicine

## 2013-09-26 DIAGNOSIS — E785 Hyperlipidemia, unspecified: Secondary | ICD-10-CM

## 2013-09-27 ENCOUNTER — Telehealth: Payer: Self-pay | Admitting: Family Medicine

## 2013-09-27 NOTE — Telephone Encounter (Signed)
Patient did not come for their scheduled appointment  09/26/13 for follow up .  Please let me know if the patient needs to be contacted immediately for follow up or if no follow up is necessary.

## 2013-09-28 NOTE — Progress Notes (Deleted)
Pre visit review using our clinic review tool, if applicable. No additional management support is needed unless otherwise documented below in the visit note. 

## 2013-09-29 NOTE — Progress Notes (Signed)
   Subjective:    Patient ID: Ian Ortega, male    DOB: Aug 28, 1980, 33 y.o.   MRN: [id number]  HPI    Review of Systems     Objective:   Physical Exam        Assessment & Plan:  No show

## 2016-02-14 ENCOUNTER — Ambulatory Visit
Admission: EM | Admit: 2016-02-14 | Discharge: 2016-02-14 | Disposition: A | Discharge status: Home / Self Care | Payer: Managed Care, Other (non HMO) | Attending: Family Medicine | Admitting: Family Medicine

## 2016-02-14 ENCOUNTER — Encounter: Payer: Self-pay | Admitting: Gynecology

## 2016-02-14 DIAGNOSIS — M545 Low back pain, unspecified: Secondary | ICD-10-CM

## 2016-02-14 MED ORDER — PREDNISONE 10 MG PO TABS: ORAL_TABLET | ORAL | 0 refills | Status: DC

## 2016-02-14 MED ORDER — CYCLOBENZAPRINE HCL 10 MG PO TABS: 10.0000 mg | ORAL_TABLET | Freq: Every evening | ORAL | 0 refills | Status: DC | PRN

## 2016-02-14 NOTE — ED Provider Notes (Signed)
[facility] ____________________________________________  Time seen: Approximately 7:10 PM  I have reviewed the triage vital signs and the nursing notes.   HISTORY  Chief Complaint Back Pain   HPI Ian Ortega is a 36 y.o. male presents with wife at bedside for the complaints of low back pain. Patient reports low back pain present for approximately 1 week. Patient reports pain onset was when getting his daughter out of bathtub. Patient reports he had been leaning on his knees beating his 3021-month-old child in the time, and reports when he stood up and was lifting her she started to squirm to get away from him and he felt a pull in his low back. Patient reports pain then worsened over the next few days and has remained. Patient reports pain is primarily with movement. Patient reports if he lies still in a comfortable position he has no pain. Patient states that the pain occasionally radiates down his right leg into the thigh but denies persistent pain radiation. Denies any paresthesias, urinary or bowel retention or incontinence or decreased range of motion. Patient reports over-the-counter Advil has been helping but no resolution. Patient reports has been taking Advil several times a day. Reports unrelieved by heat. Patient reports history of similar in the past that responded well to muscle relaxants. Denies fall to the ground or direct injury. Denies fevers.  Denies chest pain, shortness of breath, abdominal pain, dysuria, extremity pain, extremity swelling or rash. Denies recent sickness. Denies recent antibiotic use.    History reviewed. No pertinent past medical history.  Patient Active Problem List   Diagnosis Date Noted  . HLD (hyperlipidemia) 04/27/2013  . OSA (obstructive sleep apnea) 05/27/2012    History reviewed. No pertinent surgical history.   No current facility-administered medications for this encounter.   Current Outpatient Prescriptions:  .   cyclobenzaprine (FLEXERIL) 10 MG tablet, Take 1 tablet (10 mg total) by mouth at bedtime as needed for muscle spasms. Do not drive while taking as can cause drowsiness, Disp: 20 tablet, Rfl: 0 .  Multiple Vitamins-Minerals (MULTIVITAMIN PO), Take 1 tablet by mouth daily., Disp: , Rfl:  .  predniSONE (DELTASONE) 10 MG tablet, Start 60 mg po day one, then 50 mg po day two, taper by 10 mg daily until complete., Disp: 21 tablet, Rfl: 0 .  simvastatin (ZOCOR) 10 MG tablet, Take 1 tablet (10 mg total) by mouth at bedtime., Disp: 90 tablet, Rfl: 3  Allergies Patient has no known allergies.  No family history on file.  Social History Social History  Substance Use Topics  . Smoking status: Never Smoker  . Smokeless tobacco: Never Used  . Alcohol use Yes     Comment: occasional    Review of Systems Constitutional: No fever/chills Eyes: No visual changes. ENT: No sore throat. Cardiovascular: Denies chest pain. Respiratory: Denies shortness of breath. Gastrointestinal: No abdominal pain.  No nausea, no vomiting.  No diarrhea.  No constipation. Genitourinary: Negative for dysuria. Musculoskeletal: Negative for back pain. Skin: Negative for rash. Neurological: Negative for headaches, focal weakness or numbness.  10-point ROS otherwise negative.  ____________________________________________   PHYSICAL EXAM:  VITAL SIGNS: ED Triage Vitals  Enc Vitals Group     BP 02/14/16 1824 (!) 136/91     Pulse Rate 02/14/16 1824 72     Resp 02/14/16 1824 18     Temp 02/14/16 1824 99.1 F (37.3 C)     Temp Source 02/14/16 1824 Oral     SpO2 02/14/16 1824  100 %     Weight 02/14/16 1826 243 lb (110.2 kg)     Height 02/14/16 1826 6\' 5"  (1.956 m)     Head Circumference --      Peak Flow --      Pain Score 02/14/16 1827 8     Pain Loc --      Pain Edu? --      Excl. in GC? --     Constitutional: Alert and oriented. Well appearing and in no acute distress. Eyes: Conjunctivae are normal. PERRL.  EOMI. ENT      Head: Normocephalic and atraumatic.      Mouth/Throat: Mucous membranes are moist. Cardiovascular: Normal rate, regular rhythm. Grossly normal heart sounds.  Good peripheral circulation. Respiratory: Normal respiratory effort without tachypnea nor retractions. Breath sounds are clear and equal bilaterally. No wheezes, rales, rhonchi. Gastrointestinal: Soft and nontender. No distention.No CVA tenderness. Musculoskeletal:  No midline cervical, thoracic or lumbar tenderness to palpation.  Except : Right lower back pain present with lumbar flexion to extension as well as rotation right and left but nontender to direct palpation, full range of motion present, no pain with standing bilateral knee lifts, no pain with bilateral plantar flexion and dorsiflexion with good strength bilaterally, no saddle anesthesia, bilateral pedal pulses equal and easily palpated. Ambulatory with steady gait. Changes positions quickly from sitting to standing to ambulating in room.       Right lower leg:  No tenderness or edema.      Left lower leg:  No tenderness or edema.  Neurologic:  Normal speech and language. Speech is normal. No gait instability.  Skin:  Skin is warm, dry and intact. No rash noted. Psychiatric: Mood and affect are normal. Speech and behavior are normal. Patient exhibits appropriate insight and judgment   ___________________________________________   LABS (all labs ordered are listed, but only abnormal results are displayed)  Labs Reviewed - No data to display ____________________________________________  PROCEDURES Procedures    INITIAL IMPRESSION / ASSESSMENT AND PLAN / ED COURSE  Pertinent labs & imaging results that were available during my care of the patient were reviewed by me and considered in my medical decision making (see chart for details).  Well-appearing patient. No acute distress. Presents for the complaints of low back pain. Patient states the pain goes all  the way across his back and intermittently right leg but more so on the right low back. No pain to direct palpation, the pain fully reproducible per patient with range of motion. Suspect inflammatory and strain injury. No point midline tenderness. Discussed with patient we'll defer x-ray at this time, patient agrees. Will treat patient with oral prednisone taper and when necessary Flexeril. Encourage rest, ice, stretching and avoidance of strenuous activity. Discussed follow-up and return parameters.Discussed indication, risks and benefits of medications with patient.  Discussed follow up with Primary care physician this week. Discussed follow up and return parameters including no resolution or any worsening concerns. Patient verbalized understanding and agreed to plan.   ____________________________________________   FINAL CLINICAL IMPRESSION(S) / ED DIAGNOSES  Final diagnoses:  Acute right-sided low back pain without sciatica     Discharge Medication List as of 02/14/2016  7:21 PM    START taking these medications   Details  predniSONE (DELTASONE) 10 MG tablet Start 60 mg po day one, then 50 mg po day two, taper by 10 mg daily until complete., Normal      Flexeril prn 10 mg qhs prn  Note:  This dictation was prepared with Dragon dictation along with smaller phrase technology. Any transcriptional errors that result from this process are unintentional.         Renford Dills, NP 02/14/16 1959    Renford Dills, NP 02/14/16 2000

## 2016-02-14 NOTE — ED Triage Notes (Signed)
Per patient x 1 week ago was taking his daughter out of the bath tub when injury his lower back

## 2016-02-14 NOTE — Discharge Instructions (Signed)
Take medication as prescribed. Rest. Drink plenty of fluids. Ice. Stretch.  ° °Follow up with your primary care physician this week as needed. Return to Urgent care for new or worsening concerns.  ° °

## 2016-02-17 ENCOUNTER — Ambulatory Visit: Payer: Self-pay | Admitting: Family Medicine

## 2016-06-05 ENCOUNTER — Encounter: Payer: Self-pay | Admitting: Nurse Practitioner

## 2016-06-05 ENCOUNTER — Ambulatory Visit (INDEPENDENT_AMBULATORY_CARE_PROVIDER_SITE_OTHER): Payer: Managed Care, Other (non HMO) | Admitting: Nurse Practitioner

## 2016-06-05 VITALS — BP 142/86 | HR 80 | Temp 98.1°F | Ht 75.5 in | Wt 251.0 lb

## 2016-06-05 DIAGNOSIS — L02433 Carbuncle of right upper limb: Secondary | ICD-10-CM

## 2016-06-05 MED ORDER — SULFAMETHOXAZOLE-TRIMETHOPRIM 800-160 MG PO TABS: 1.0000 | ORAL_TABLET | Freq: Two times a day (BID) | ORAL | 0 refills | Status: AC

## 2016-06-05 NOTE — Patient Instructions (Addendum)
Apply warm compress 2-3times a day (15/6820mins at a time).  Clean wound with warm water and antibacterial soap.  Change dressing once or twice a day.  Return to office if no improvement in 1week  Skin Abscess A skin abscess is an infected area on or under your skin that contains pus and other material. An abscess can happen almost anywhere on your body. Some abscesses break open (rupture) on their own. Most continue to get worse unless they are treated. The infection can spread deeper into the body and into your blood, which can make you feel sick. Treatment usually involves draining the abscess. Follow these instructions at home: Abscess Care   If you have an abscess that has not drained, place a warm, clean, wet washcloth over the abscess several times a day. Do this as told by your doctor.  Follow instructions from your doctor about how to take care of your abscess. Make sure you:  Cover the abscess with a bandage (dressing).  Change your bandage or gauze as told by your doctor.  Wash your hands with soap and water before you change the bandage or gauze. If you cannot use soap and water, use hand sanitizer.  Check your abscess every day for signs that the infection is getting worse. Check for:  More redness, swelling, or pain.  More fluid or blood.  Warmth.  More pus or a bad smell. Medicines    Take over-the-counter and prescription medicines only as told by your doctor.  If you were prescribed an antibiotic medicine, take it as told by your doctor. Do not stop taking the antibiotic even if you start to feel better. General instructions   To avoid spreading the infection:  Do not share personal care items, towels, or hot tubs with others.  Avoid making skin-to-skin contact with other people.  Keep all follow-up visits as told by your doctor. This is important. Contact a doctor if:  You have more redness, swelling, or pain around your abscess.  You have more fluid  or blood coming from your abscess.  Your abscess feels warm when you touch it.  You have more pus or a bad smell coming from your abscess.  You have a fever.  Your muscles ache.  You have chills.  You feel sick. Get help right away if:  You have very bad (severe) pain.  You see red streaks on your skin spreading away from the abscess. This information is not intended to replace advice given to you by your health care provider. Make sure you discuss any questions you have with your health care provider. Document Released: 06/24/2007 Document Revised: 09/01/2015 Document Reviewed: 11/14/2014 Else[employer] Interactive Patient Education  2017 [employer]

## 2016-06-05 NOTE — Progress Notes (Signed)
Subjective:  Patient ID: Ian Ortega, male    DOB: 02/07/1980  Age: [age] y.o. MRN: [id number]  CC: Insect Bite (insect bite,red,swelling,sore 1 wk ago)   Rash  This is a new problem. The current episode started in the past 7 days. The problem is unchanged. The affected locations include the right arm. The rash is characterized by pain, redness and swelling. It is unknown if there was an exposure to a precipitant. Pertinent negatives include no fatigue, fever or joint pain. Past treatments include nothing.    Outpatient Medications Prior to Visit  Medication Sig Dispense Refill  . cyclobenzaprine (FLEXERIL) 10 MG tablet Take 1 tablet (10 mg total) by mouth at bedtime as needed for muscle spasms. Do not drive while taking as can cause drowsiness (Patient not taking: Reported on 06/05/2016) 20 tablet 0  . Multiple Vitamins-Minerals (MULTIVITAMIN PO) Take 1 tablet by mouth daily.    . predniSONE (DELTASONE) 10 MG tablet Start 60 mg po day one, then 50 mg po day two, taper by 10 mg daily until complete. (Patient not taking: Reported on 06/05/2016) 21 tablet 0  . simvastatin (ZOCOR) 10 MG tablet Take 1 tablet (10 mg total) by mouth at bedtime. (Patient not taking: Reported on 06/05/2016) 90 tablet 3   No facility-administered medications prior to visit.     ROS See HPI  Objective:  BP (!) 142/86   Pulse 80   Temp 98.1 F (36.7 C)   Ht 6' 3.5" (1.918 m)   Wt 251 lb (113.9 kg)   SpO2 99%   BMI 30.96 kg/m   BP Readings from Last 3 Encounters:  06/05/16 (!) 142/86  02/14/16 (!) 136/91  07/04/13 120/68    Wt Readings from Last 3 Encounters:  06/05/16 251 lb (113.9 kg)  02/14/16 243 lb (110.2 kg)  07/04/13 251 lb (113.9 kg)    Physical Exam  Constitutional: He is oriented to person, place, and time. No distress.  Cardiovascular: Normal rate.   Pulmonary/Chest: Effort normal.  Musculoskeletal:       Right wrist: Normal.       Right forearm: Normal.       Right hand: Normal.    Neurological: He is alert and oriented to person, place, and time.  Skin: Skin is warm. Rash noted. Rash is pustular. There is erythema.  Vitals reviewed.  Procedure note: Incision and Drainage of an right forearm Abscess   Indication : a localized collection of pus that is tender and not spontaneously resolving.   Risks including unsuccessful procedure , possible need for a repeat procedure due to pus accumulation, scar formation, and others as well as benefits were explained to the patient in detail. Written consent was obtained/signed.    The patient was placed in a decubitus position. The area of an abscess on R forearm was prepped with povidone-iodine and draped in a sterile fashion. Blister incised, scab removed. About 1 cc of purulent material was expressed.The cavity was cleaned.  The wound was dressed with antibiotic ointment and a lrge bandaid.  Tolerated well. Complications: None.   Wound instructions provided.    Please contact us if you notice a recollection of pus in the abscess fever and chills increased pain redness red streaks near the abscess increased swelling in the area.   Lab Results  Component Value Date   WBC 7.2 10/15/2010   HGB 16.7 10/15/2010   HCT 49.0 10/15/2010   PLT 258 10/15/2010   GLUCOSE 104 (H) 06/27/2013  CHOL 216 (H) 06/27/2013   TRIG 314.0 (H) 06/27/2013   HDL 35.00 (L) 06/27/2013   LDLDIRECT 135.4 04/27/2012   LDLCALC 118 (H) 06/27/2013   ALT 21 06/27/2013   AST 27 06/27/2013   NA 138 06/27/2013   K 4.7 06/27/2013   CL 102 06/27/2013   CREATININE 1.0 06/27/2013   BUN 10 06/27/2013   CO2 30 06/27/2013    No results found.  Assessment & Plan:   Ian Ortega was seen today for insect bite.  Diagnoses and all orders for this visit:  Carbuncle of arm, right -     sulfamethoxazole-trimethoprim (BACTRIM DS,SEPTRA DS) 800-160 MG tablet; Take 1 tablet by mouth 2 (two) times daily.   I am having Ian Ortega start on  sulfamethoxazole-trimethoprim. I am also having him maintain his Multiple Vitamins-Minerals (MULTIVITAMIN PO), simvastatin, predniSONE, and cyclobenzaprine.  Meds ordered this encounter  Medications  . sulfamethoxazole-trimethoprim (BACTRIM DS,SEPTRA DS) 800-160 MG tablet    Sig: Take 1 tablet by mouth 2 (two) times daily.    Dispense:  10 tablet    Refill:  0    Order Specific Question:   Supervising Provider    Answer:   Tresa Garter [[id number]]    Follow-up: Return if symptoms worsen or fail to improve.  Alysia Penna, NP

## 2016-06-07 ENCOUNTER — Encounter: Payer: Self-pay | Admitting: Emergency Medicine

## 2016-06-07 ENCOUNTER — Ambulatory Visit
Admission: EM | Admit: 2016-06-07 | Discharge: 2016-06-07 | Disposition: A | Discharge status: Home / Self Care | Payer: Managed Care, Other (non HMO) | Attending: Family Medicine | Admitting: Family Medicine

## 2016-06-07 DIAGNOSIS — L03113 Cellulitis of right upper limb: Secondary | ICD-10-CM

## 2016-06-07 DIAGNOSIS — L02413 Cutaneous abscess of right upper limb: Secondary | ICD-10-CM

## 2016-06-07 MED ORDER — DOXYCYCLINE HYCLATE 100 MG PO CAPS: 100.0000 mg | ORAL_CAPSULE | Freq: Two times a day (BID) | ORAL | 0 refills | Status: AC

## 2016-06-07 MED ORDER — MUPIROCIN 2 % EX OINT: 1.0000 "application " | TOPICAL_OINTMENT | Freq: Three times a day (TID) | CUTANEOUS | 0 refills | Status: AC

## 2016-06-07 MED ORDER — CEFTRIAXONE SODIUM 1 G IJ SOLR
1.0000 g | Freq: Once | INTRAMUSCULAR | Status: AC
  Administered 2016-06-07: 1 g via INTRAMUSCULAR

## 2016-06-07 NOTE — ED Provider Notes (Signed)
CSN: [id number]     Arrival date & time 06/07/16  1128 History   First MD Initiated Contact with Patient 06/07/16 1335     Chief Complaint  Patient presents with  . Arm Swelling   (Consider location/radiation/quality/duration/timing/severity/associated sxs/prior Treatment) HPI  Sit 36 year old male who is accompanied by his wife presents with redness swelling and pain in his right lower arm that he's had for approximately 1 week. Elevation states that it is noticed that he had an itchy area on his radial distal forearm and he scratched it. He then noticed that it be started having a small pimple and he squeezed that. Subsequently became more inflamed. He saw the primary care physician and nurse practitioner lance the area but did not get any purulence. She started him on some Bactrim and a been using Neosporin on top. Also been using warm compresses with "boiling" water. Despite that he has been having increasing pain redness swelling and hardness. He has felt hot fatigued. His temperature today is 99.8 and he did not take any antipyretics prior to arriving today.       History reviewed. No pertinent past medical history. History reviewed. No pertinent surgical history. History reviewed. No pertinent family history. Social History  Substance Use Topics  . Smoking status: Never Smoker  . Smokeless tobacco: Never Used  . Alcohol use Yes     Comment: occasional    Review of Systems  Constitutional: Positive for activity change, chills, fatigue and fever.  Skin: Positive for color change and wound.  All other systems reviewed and are negative.   Allergies  Patient has no known allergies.  Home Medications   Prior to Admission medications   Medication Sig Start Date End Date Taking? Authorizing Provider  doxycycline (VIBRAMYCIN) 100 MG capsule Take 1 capsule (100 mg total) by mouth 2 (two) times daily. 06/07/16   Lutricia Feil, PA-C  Multiple Vitamins-Minerals (MULTIVITAMIN PO)  Take 1 tablet by mouth daily.    [provider]  mupirocin ointment (BACTROBAN) 2 % Apply 1 application topically 3 (three) times daily. 06/07/16   Lutricia Feil, PA-C  sulfamethoxazole-trimethoprim (BACTRIM DS,SEPTRA DS) 800-160 MG tablet Take 1 tablet by mouth 2 (two) times daily. 06/05/16   Nche, Bonna Gains, NP   Meds Ordered and Administered this Visit   Medications  cefTRIAXone (ROCEPHIN) injection 1 g (1 g Intramuscular Given 06/07/16 1400)    BP (!) 146/87 (BP Location: Left Arm)   Pulse 97   Temp 99.8 F (37.7 C) (Oral)   Resp 16   Ht 6\' 4"  (1.93 m)   Wt 250 lb (113.4 kg)   SpO2 99%   BMI 30.43 kg/m  No data found.   Physical Exam  Constitutional: He is oriented to person, place, and time. He appears well-developed and well-nourished. No distress.  HENT:  Head: Normocephalic.  Eyes: Pupils are equal, round, and reactive to light.  Neck: Normal range of motion.  Musculoskeletal: Normal range of motion. He exhibits tenderness.  Neurological: He is alert and oriented to person, place, and time.  Skin: Skin is warm and dry. He is not diaphoretic.  Examination of the right dominant upper extremity shows a round area with a crater centered with necrotic debris in the center portion. No fluctuance. The patient does have induration distally  extending approximately 3 finger breaths. There is erythema extending up into the arm to the level of the antecubital fossa. Arm circumference 4 finger breaths below the antecubital fold on  the right is 32-1/2 cm on the left 30 cm. Patient is very tender to touch of the distal arm. Wrist range of motion is full, finger range of motion is full and comfortable. Area of induration was marked with a skin marker measures 9 cm longitudinally and transversely 11 cm  Psychiatric: He has a normal mood and affect. His behavior is normal. Judgment and thought content normal.  Nursing note and vitals reviewed.         Urgent Care  Course     Procedures (including critical care time)  Labs Review Labs Reviewed - No data to display  Imaging Review No results found.   Visual Acuity Review  Right Eye Distance:   Left Eye Distance:   Bilateral Distance:    Right Eye Near:   Left Eye Near:    Bilateral Near:    Medications  cefTRIAXone (ROCEPHIN) injection 1 g (1 g Intramuscular Given 06/07/16 1400)       MDM   1. Cellulitis of right upper extremity   2. Abscess of arm, right    New Prescriptions   DOXYCYCLINE (VIBRAMYCIN) 100 MG CAPSULE    Take 1 capsule (100 mg total) by mouth 2 (two) times daily.   MUPIROCIN OINTMENT (BACTROBAN) 2 %    Apply 1 application topically 3 (three) times daily.  Plan: 1. Test/x-ray results and diagnosis reviewed with patient 2. rx as per orders; risks, benefits, potential side effects reviewed with patient 3. Recommend supportive treatment with Compresses 4 times a day specifically instructed to the family and the patient. Dry thoroughly afterwards and apply Bactroban. Cover with a dry sterile dressing and Coban . If he develops a fever greater than 101F or has worsening of his pain or swelling diaphoresis he should go immediately to the emergency department. Otherwise I have asked him to return in 24 hours for close following of the cellulitis. If it is not improving or worsening he should go to the emergency department they understand this fully. I have stopped the Bactrim and switch him to doxycycline for better coverage. 4. F/u prn if symptoms worsen or don't improve     Lutricia FeilRoemer, Heavin Sebree P, PA-C 06/07/16 1440

## 2016-06-07 NOTE — ED Triage Notes (Signed)
Patient reports redness, swelling and pain in his right lower arm for about a week.  Patient states that his PCP gave him Bactrim to start on Friday.

## 2016-06-08 ENCOUNTER — Encounter: Payer: Self-pay | Admitting: Emergency Medicine

## 2016-06-08 ENCOUNTER — Telehealth: Payer: Self-pay

## 2016-06-08 ENCOUNTER — Ambulatory Visit
Admission: EM | Admit: 2016-06-08 | Discharge: 2016-06-08 | Disposition: A | Discharge status: Home / Self Care | Payer: Managed Care, Other (non HMO) | Attending: Family Medicine | Admitting: Family Medicine

## 2016-06-08 DIAGNOSIS — M79601 Pain in right arm: Secondary | ICD-10-CM | POA: Diagnosis not present

## 2016-06-08 DIAGNOSIS — Z23 Encounter for immunization: Secondary | ICD-10-CM | POA: Diagnosis not present

## 2016-06-08 DIAGNOSIS — L03113 Cellulitis of right upper limb: Secondary | ICD-10-CM | POA: Diagnosis not present

## 2016-06-08 MED ORDER — TETANUS-DIPHTH-ACELL PERTUSSIS 5-2.5-18.5 LF-MCG/0.5 IM SUSP
0.5000 mL | Freq: Once | INTRAMUSCULAR | Status: AC
  Administered 2016-06-08: 0.5 mL via INTRAMUSCULAR

## 2016-06-08 MED ORDER — OXYCODONE-ACETAMINOPHEN 5-325 MG PO TABS: 1.0000 | ORAL_TABLET | Freq: Three times a day (TID) | ORAL | 0 refills | Status: AC | PRN

## 2016-06-08 NOTE — Telephone Encounter (Signed)
PLEASE NOTE: All timestamps contained within this report are represented as [place] Standard Time. CONFIDENTIALTY NOTICE: This fax transmission is intended only for the addressee. It contains information that is legally privileged, confidential or otherwise protected from use or disclosure. If you are not the intended recipient, you are strictly prohibited from reviewing, disclosing, copying using or disseminating any of this information or taking any action in reliance on or regarding this information. If you have received this fax in error, please notify us immediately by telephone so that we can arrange for its return to us. Phone: [phone], Toll-Free: [phone], Fax: [phone] Page: 1 of 1 Call Id: [id number] [facility] - Client TELEPHONE ADVICE RECORD [facility] Patient Name: Tonia GhentMARLO Almquist Gender: Male DOB: 06/13/1980 Age: [age] Y 2 M 16 D Return Phone Number: [phone] (Primary) [place]Zip: [place] [id number] Client [facility] - Night Physician Ruthe MannanAron, Talia - MD Who Is Calling Patient / Member / Family / Caregiver Call Type Triage / Clinical Caller Name Conan BowensShonetta Kleiman Relationship To Patient Spouse Return Phone Number [phone] (Primary) Chief Complaint Infection Exposure Reason for Call Symptomatic / Request for Health Information Initial Comment Caller states husband was diagnosed with an abcess, and bactrim is not working for him. They usually get Kaflex. Infection on arm, and now upto his elbow. Nurse Assessment Nurse: Joylene DraftMills-Hernandez, RN, Harriett SineNancy Date/Time (Eastern Time): 06/07/2016 10:06:57 AM Confirm and document reason for call. If symptomatic, describe symptoms. ---Caller states husband was diagnosed with an abcess on Friday and bactrim is not working for him. They usually get Keflex. Infection on arm, and now up to his  elbow. Does the PT have any chronic conditions? (i.e. diabetes, asthma, etc.) ---No Guidelines Guideline Title Affirmed Question Wound Infection [1] Red streak runs from the wound AND [2] longer than 1 inch (2.5 cm) Disp. Time Lamount Cohen(Eastern Time) Disposition Final User 06/07/2016 10:13:22 AM See Physician within 4 Hours (or PCP triage) Yes Mills-Hernandez, RN, Harriett SineNancy Referrals GO TO FACILITY OTHER - SPECIFY Care Advice Given Per Guideline SEE PHYSICIAN WITHIN 4 HOURS (or PCP triage): * IF OFFICE WILL BE CLOSED AND NO PCP TRIAGE: You need to be seen within the next 3 or 4 hours. A nearby Urgent Care Center is often a good source of care. Another choice is to go to the ER. Go sooner if you become worse. CALL BACK IF: * You become worse. CARE ADVICE per Wound Infection (Adult) guideline.

## 2016-06-08 NOTE — ED Provider Notes (Signed)
[facility] ____________________________________________  Time seen: Approximately 11:06 AM  I have reviewed the triage vital signs and the nursing notes.   HISTORY  Chief Complaint Cellulitis   HPI Ian Ortega is a 36 y.o. male presenting with wife at bedside for reevaluation of right forearm infection and abscess. Patient reports that he was seen yesterday in urgent care for the same complaint, and presenting today for wound check as well as pain. Patient states that he was initially seen for this area that has been gradual onset for several days, last Thursday at outpatient facility and he had an incision of the area that did not drain much.States started with a small bump. Denies known insect bites. Denies tick bites or attachments. Reports he was then started on oral Bactrim, but does report being seen in urgent care yesterday as as the bactrim did not seem to be helping the redness, swelling and pain. Patient states yesterday was started on oral doxycycline, stop the Bactrim and was given a shot of Rocephin while in urgent care. Patient presenting today for recheck of wound and infection. Patient states overall area is about the same but may have improved some. Denies any worsening. Denies any pain radiation or paresthesias. Reports moderate pain directly at the site which is consistent for the last several days without change. Patient states he has had two doses of doxycycline so far. Patient states mild pain at this time but states more pain at night while he is trying to get comfortable and states that pain keeps him up. States wound had no scabbing until I/D by outpatient visit last Thursday.  Denies chest pain, shortness of breath, abdominal pain, dysuria, other extremity pain, other extremity swelling. Denies recent sickness.  Patient denies history of similar in the past. Denies and history of MRSA. Unsure of last tetanus immunization.Denies history of blood clots.     History reviewed. No pertinent past medical history.  Patient Active Problem List   Diagnosis Date Noted  . HLD (hyperlipidemia) 04/27/2013  . OSA (obstructive sleep apnea) 05/27/2012    History reviewed. No pertinent surgical history.   No current facility-administered medications for this encounter.   Current Outpatient Prescriptions:  .  doxycycline (VIBRAMYCIN) 100 MG capsule, Take 1 capsule (100 mg total) by mouth 2 (two) times daily., Disp: 14 capsule, Rfl: 0 .  Multiple Vitamins-Minerals (MULTIVITAMIN PO), Take 1 tablet by mouth daily., Disp: , Rfl:  .  mupirocin ointment (BACTROBAN) 2 %, Apply 1 application topically 3 (three) times daily., Disp: 22 g, Rfl: 0 .  oxyCODONE-acetaminophen (ROXICET) 5-325 MG tablet, Take 1 tablet by mouth every 8 (eight) hours as needed for moderate pain or severe pain (Do not drive or operate heavy machinery while taking as can cause drowsiness.)., Disp: 9 tablet, Rfl: 0 .  sulfamethoxazole-trimethoprim (BACTRIM DS,SEPTRA DS) 800-160 MG tablet, Take 1 tablet by mouth 2 (two) times daily., Disp: 10 tablet, Rfl: 0  Allergies Patient has no known allergies.  History reviewed. No pertinent family history.  Social History Social History  Substance Use Topics  . Smoking status: Never Smoker  . Smokeless tobacco: Never Used  . Alcohol use Yes     Comment: occasional    Review of Systems Constitutional: Patient states that he had some chills last night. Denies and fevers. Cardiovascular: Denies chest pain. Respiratory: Denies shortness of breath. Gastrointestinal: No abdominal pain.  No nausea, no vomiting.  No diarrhea.  No constipation. Genitourinary: Negative for dysuria. Musculoskeletal: Negative for back  pain. Skin: as above.  ____________________________________________   PHYSICAL EXAM:  VITAL SIGNS: ED Triage Vitals  Enc Vitals Group     BP 06/08/16 0940 (!) 124/109     Pulse Rate 06/08/16 0940 95     Resp 06/08/16 0940  18     Temp 06/08/16 0940 98.4 F (36.9 C)     Temp Source 06/08/16 0940 Oral     SpO2 06/08/16 0940 100 %     Weight 06/08/16 0938 250 lb (113.4 kg)     Height 06/08/16 0938 6\' 4"  (1.93 m)     Head Circumference --      Peak Flow --      Pain Score 06/08/16 0938 6     Pain Loc --      Pain Edu? --      Excl. in GC? --    Vitals:   06/08/16 0938 06/08/16 0940 06/08/16 1034  BP:  (!) 124/109 120/81  Pulse:  95   Resp:  18   Temp:  98.4 F (36.9 C)   TempSrc:  Oral   SpO2:  100%   Weight: 250 lb (113.4 kg)    Height: 6\' 4"  (1.93 m)       Constitutional: Alert and oriented. Well appearing and in no acute distress. Cardiovascular: Normal rate, regular rhythm. Grossly normal heart sounds.  Good peripheral circulation. Respiratory: Normal respiratory effort without tachypnea nor retractions. Breath sounds are clear and equal bilaterally. No wheezes, rales, rhonchi. Musculoskeletal: No midline cervical, thoracic or lumbar tenderness to palpation.  Neurologic:  Normal speech and language. Speech is normal. No gait instability.  Skin:  Skin is warm, dry. Except:  Right distal forearm wound with dark appearing scabbing <1cm with surrounding immediate moderate erythema, mild to moderate localized swelling that is non-circumferential, indurated areas approximately 9 x 10 cm surrounding the wound, minimal erythema on the anterior portion of the forearm up to elbow but does not extend proximally or to the hand and is non-circumferential, no active drainage, right hand nontender, bilateral hand grips strong and equal, bilateral distal radial pulses equal, right hand distal sensation normal with normal capillary refill, normal motor and tenderness function of right hand. Right upper extremity otherwise nontender. Psychiatric: Mood and affect are normal. Speech and behavior are normal. Patient exhibits appropriate insight and judgment   ___________________________________________   LABS (all  labs ordered are listed, but only abnormal results are displayed)  Labs Reviewed - No data to display ____________________________________________  PROCEDURES Procedures   INITIAL IMPRESSION / ASSESSMENT AND PLAN / ED COURSE  Pertinent labs & imaging results that were available during my care of the patient were reviewed by me and considered in my medical decision making (see chart for details).  Well-appearing patient. No acute distress. Wife at bedside. Per patient with right forearm wound, redness, swelling and pain is consistent with yesterday's and has not worsened, and states may have slightly improved. Patient is presenting with a less than 24 hours of it change of antibiotic and discussed in detail with patient unable to capillary see if antibiotic change will be beneficial versus need IV antibiotics at this time. Appears to continue to have cellulitis surrounding a wound of right forearm, vascularity appears to be intact though cautioned of venous thrombosis of arm and family verbalized understanding and states will monitor closely swelling but will continue outpatient treatment versus seeking imaging at this time. Discuss with family doubt thrombosis, suspect continued cellulitis. Tetanus immunization updated . Continue Bactrim  and bactroban. Patient has not been started on any pain medication for the same complaint. Discussed with patient we'll give quantity #9 Percocet as needed for breakthrough pain and encouraged using half tablet as needed. Encourage elevation and close monitoring. Discussed strict follow-up and return parameters including going to the emergency room for any increased pain, paresthesias, fevers, increased redness or swelling or new complaints. Also discussed with patient encouraged wound check in 2-3 days.Discussed indication, risks and benefits of medications with patient.   [place] controlled substance database reviewed for last one year, and most recent  controlled medication 05/11/16 #10 tramadol.   Discussed follow up with Primary care physician this week. Discussed follow up and return parameters including no resolution or any worsening concerns. Patient verbalized understanding and agreed to plan.   ____________________________________________   FINAL CLINICAL IMPRESSION(S) / ED DIAGNOSES  Final diagnoses:  Cellulitis of right upper arm     Discharge Medication List as of 06/08/2016 10:24 AM    START taking these medications   Details  oxyCODONE-acetaminophen (ROXICET) 5-325 MG tablet Take 1 tablet by mouth every 8 (eight) hours as needed for moderate pain or severe pain (Do not drive or operate heavy machinery while taking as can cause drowsiness.)., Starting Mon 06/08/2016, Print        Note: This dictation was prepared with Dragon dictation along with smaller phrase technology. Any transcriptional errors that result from this process are unintentional.         Renford Dills, NP 06/08/16 1254

## 2016-06-08 NOTE — ED Triage Notes (Signed)
Patient here for re-check of cellulitis of right forearm

## 2016-06-08 NOTE — Discharge Instructions (Signed)
Take medication as prescribed. Rest. Elevate. Drink plenty of fluids. Monitor closely. As discussed, for any worsening return to urgent or proceed to ER as discussed.   Follow up with your primary care physician this week in 2-3 days. Return to Urgent care for new or worsening concerns.

## 2016-06-08 NOTE — Telephone Encounter (Signed)
Per chart review tab pt seen [facility] 06/07/16 and 06/08/16.

## 2017-05-25 ENCOUNTER — Ambulatory Visit (INDEPENDENT_AMBULATORY_CARE_PROVIDER_SITE_OTHER): Payer: Managed Care, Other (non HMO) | Admitting: Family Medicine

## 2017-05-25 ENCOUNTER — Encounter: Payer: Self-pay | Admitting: Family Medicine

## 2017-05-25 VITALS — BP 130/80 | HR 70 | Temp 98.3°F | Ht 76.0 in | Wt 253.2 lb

## 2017-05-25 DIAGNOSIS — E785 Hyperlipidemia, unspecified: Secondary | ICD-10-CM

## 2017-05-25 DIAGNOSIS — Z0000 Encounter for general adult medical examination without abnormal findings: Secondary | ICD-10-CM | POA: Diagnosis not present

## 2017-05-25 DIAGNOSIS — G4733 Obstructive sleep apnea (adult) (pediatric): Secondary | ICD-10-CM

## 2017-05-25 LAB — COMPREHENSIVE METABOLIC PANEL
ALBUMIN: 4.4 g/dL (ref 3.5–5.2)
ALT: 19 U/L (ref 0–53)
AST: 18 U/L (ref 0–37)
Alkaline Phosphatase: 50 U/L (ref 39–117)
BUN: 8 mg/dL (ref 6–23)
CALCIUM: 9.8 mg/dL (ref 8.4–10.5)
CHLORIDE: 101 meq/L (ref 96–112)
CO2: 30 meq/L (ref 19–32)
Creatinine, Ser: 0.81 mg/dL (ref 0.40–1.50)
GFR: 136.31 mL/min (ref >60.00)
Glucose, Bld: 87 mg/dL (ref 70–99)
Potassium: 4.1 mEq/L (ref 3.5–5.1)
Sodium: 137 mEq/L (ref 135–145)
Total Bilirubin: 0.4 mg/dL (ref 0.2–1.2)
Total Protein: 7.4 g/dL (ref 6.0–8.3)

## 2017-05-25 LAB — LIPID PANEL
CHOL/HDL RATIO: 6
Cholesterol: 228 mg/dL — ABNORMAL HIGH (ref 0–200)
HDL: 36.1 mg/dL — AB (ref >39.00)
NonHDL: 191.92
TRIGLYCERIDES: 236 mg/dL — AB (ref 0.0–149.0)
VLDL: 47.2 mg/dL — AB (ref 0.0–40.0)

## 2017-05-25 LAB — LDL CHOLESTEROL, DIRECT: LDL DIRECT: 151 mg/dL

## 2017-05-25 NOTE — Progress Notes (Signed)
Subjective:   Patient ID: Ian Ortega, male    DOB: January 28, 1980, 37 y.o.   MRN: [id number]  Ian Ortega is a pleasant 37 y.o. year old male who presents to clinic today with Annual Exam (CPE, not fasting (bowl of cereal) re-estab care)  on 05/25/2017  HPI:  Doing well.  I have not seen him since 2015.  Work is going well and his three children keep him busy.  He used to take a statin for HLD but has not had follow up labs or rx in years.  Has no complaints today.  Current Outpatient Medications on File Prior to Visit  Medication Sig Dispense Refill  . amoxicillin (AMOXIL) 500 MG capsule     . doxycycline (VIBRAMYCIN) 100 MG capsule Take 1 capsule (100 mg total) by mouth 2 (two) times daily. (Patient not taking: Reported on 05/25/2017) 14 capsule 0  . Multiple Vitamins-Minerals (MULTIVITAMIN PO) Take 1 tablet by mouth daily.    . mupirocin ointment (BACTROBAN) 2 % Apply 1 application topically 3 (three) times daily. (Patient not taking: Reported on 05/25/2017) 22 g 0  . oxyCODONE-acetaminophen (ROXICET) 5-325 MG tablet Take 1 tablet by mouth every 8 (eight) hours as needed for moderate pain or severe pain (Do not drive or operate heavy machinery while taking as can cause drowsiness.). (Patient not taking: Reported on 05/25/2017) 9 tablet 0  . sulfamethoxazole-trimethoprim (BACTRIM DS,SEPTRA DS) 800-160 MG tablet Take 1 tablet by mouth 2 (two) times daily. (Patient not taking: Reported on 05/25/2017) 10 tablet 0   No current facility-administered medications on file prior to visit.     No Known Allergies  No past medical history on file.  No past surgical history on file.  No family history on file.  Social History   Socioeconomic History  . Marital status: Married    Spouse name: Paediatric nurse  . Number of children: 2  . Years of education: Not on file  . Highest education level: Not on file  Occupational History    Employer: [employer]  Social Needs  . Financial  resource strain: Not on file  . Food insecurity:    Worry: Not on file    Inability: Not on file  . Transportation needs:    Medical: Not on file    Non-medical: Not on file  Tobacco Use  . Smoking status: Never Smoker  . Smokeless tobacco: Never Used  Substance and Sexual Activity  . Alcohol use: Yes    Comment: occasional  . Drug use: No  . Sexual activity: Not on file  Lifestyle  . Physical activity:    Days per week: Not on file    Minutes per session: Not on file  . Stress: Not on file  Relationships  . Social connections:    Talks on phone: Not on file    Gets together: Not on file    Attends religious service: Not on file    Active member of club or organization: Not on file    Attends meetings of clubs or organizations: Not on file    Relationship status: Not on file  . Intimate partner violence:    Fear of current or ex partner: Not on file    Emotionally abused: Not on file    Physically abused: Not on file    Forced sexual activity: Not on file  Other Topics Concern  . Not on file  Social History Narrative   Works at [employer].   Married to  Conan Bowens.   The PMH, PSH, Social History, Family History, Medications, and allergies have been reviewed in [facility], and have been updated if relevant.  Review of Systems  Constitutional: Negative.   HENT: Negative.   Eyes: Negative.   Respiratory: Negative.   Cardiovascular: Negative.   Gastrointestinal: Negative.   Endocrine: Negative.   Genitourinary: Negative.   Musculoskeletal: Negative.   Skin: Negative.   Allergic/Immunologic: Negative.   Neurological: Negative.   Hematological: Negative.   Psychiatric/Behavioral: Negative.   All other systems reviewed and are negative.      Objective:    BP 130/80 (BP Location: Left Arm, Patient Position: Sitting, Cuff Size: Normal)   Pulse 70   Temp 98.3 F (36.8 C) (Oral)   Ht  (1.93 m)   Wt 253 lb 3.2 oz (114.9 kg)   SpO2 95%   BMI 30.82 kg/m     Physical Exam  General:  pleasant male in no acute distress Eyes:  PERRL Ears:  External ear exam shows no significant lesions or deformities.  TMs normal bilaterally Hearing is grossly normal bilaterally. Nose:  External nasal examination shows no deformity or inflammation. Nasal mucosa are pink and moist without lesions or exudates. Mouth:  Oral mucosa and oropharynx without lesions or exudates.  Teeth in good repair. Neck:  no carotid bruit or thyromegaly no cervical or supraclavicular lymphadenopathy  Lungs:  Normal respiratory effort, chest expands symmetrically. Lungs are clear to auscultation, no crackles or wheezes. Heart:  Normal rate and regular rhythm. S1 and S2 normal without gallop, murmur, click, rub or other extra sounds. Abdomen:  Bowel sounds positive,abdomen soft and non-tender without masses, organomegaly or hernias noted. Pulses:  R and L posterior tibial pulses are full and equal bilaterally  Extremities:  no edema  Psych:  Good eye contact, not anxious or depressed appearing        Assessment & Plan:   Hyperlipidemia, unspecified hyperlipidemia type  OSA (obstructive sleep apnea)  Visit for well man health check No follow-ups on file.

## 2017-05-25 NOTE — Patient Instructions (Signed)
Great to see you. Say hi to the family for me.  I will call you with your lab results from today and you can view them online.

## 2017-05-25 NOTE — Assessment & Plan Note (Signed)
Lipid panel today

## 2017-05-25 NOTE — Assessment & Plan Note (Signed)
Reviewed preventive care protocols, scheduled due services, and updated immunizations Discussed nutrition, exercise, diet, and healthy lifestyle.  Orders Placed This Encounter  Procedures  . Lipid panel  . Comprehensive metabolic panel

## 2018-12-02 ENCOUNTER — Other Ambulatory Visit: Payer: Self-pay

## 2018-12-02 ENCOUNTER — Encounter (HOSPITAL_COMMUNITY): Payer: Self-pay

## 2018-12-02 ENCOUNTER — Emergency Department (HOSPITAL_COMMUNITY)
Admission: EM | Admit: 2018-12-02 | Discharge: 2018-12-02 | Disposition: A | Discharge status: Home / Self Care | Payer: BC Managed Care – PPO | Attending: Emergency Medicine | Admitting: Emergency Medicine

## 2018-12-02 ENCOUNTER — Emergency Department (HOSPITAL_COMMUNITY): Payer: BC Managed Care – PPO

## 2018-12-02 DIAGNOSIS — Z79899 Other long term (current) drug therapy: Secondary | ICD-10-CM | POA: Diagnosis not present

## 2018-12-02 DIAGNOSIS — R109 Unspecified abdominal pain: Secondary | ICD-10-CM | POA: Diagnosis present

## 2018-12-02 DIAGNOSIS — N200 Calculus of kidney: Secondary | ICD-10-CM | POA: Insufficient documentation

## 2018-12-02 LAB — URINALYSIS, ROUTINE W REFLEX MICROSCOPIC
Bilirubin Urine: NEGATIVE
Glucose, UA: NEGATIVE mg/dL
Ketones, ur: NEGATIVE mg/dL
Nitrite: NEGATIVE
Protein, ur: NEGATIVE mg/dL
Specific Gravity, Urine: 1.016 (ref 1.005–1.030)
pH: 5 (ref 5.0–8.0)

## 2018-12-02 LAB — COMPREHENSIVE METABOLIC PANEL
ALT: 29 U/L (ref 0–44)
AST: 38 U/L (ref 15–41)
Albumin: 4.6 g/dL (ref 3.5–5.0)
Alkaline Phosphatase: 48 U/L (ref 38–126)
Anion gap: 9 (ref 5–15)
BUN: 12 mg/dL (ref 6–20)
CO2: 24 mmol/L (ref 22–32)
Calcium: 9.2 mg/dL (ref 8.9–10.3)
Chloride: 103 mmol/L (ref 98–111)
Creatinine, Ser: 1.3 mg/dL — ABNORMAL HIGH (ref 0.61–1.24)
GFR calc Af Amer: >60 mL/min (ref >60)
GFR calc non Af Amer: >60 mL/min (ref >60)
Glucose, Bld: 123 mg/dL — ABNORMAL HIGH (ref 70–99)
Potassium: 5.6 mmol/L — ABNORMAL HIGH (ref 3.5–5.1)
Sodium: 136 mmol/L (ref 135–145)
Total Bilirubin: 1.2 mg/dL (ref 0.3–1.2)
Total Protein: 8.1 g/dL (ref 6.5–8.1)

## 2018-12-02 LAB — CBC
HCT: 45.3 % (ref 39.0–52.0)
Hemoglobin: 14.7 g/dL (ref 13.0–17.0)
MCH: 27.8 pg (ref 26.0–34.0)
MCHC: 32.5 g/dL (ref 30.0–36.0)
MCV: 85.8 fL (ref 80.0–100.0)
Platelets: 225 10*3/uL (ref 150–400)
RBC: 5.28 MIL/uL (ref 4.22–5.81)
RDW: 13.2 % (ref 11.5–15.5)
WBC: 13 10*3/uL — ABNORMAL HIGH (ref 4.0–10.5)
nRBC: 0 % (ref 0.0–0.2)

## 2018-12-02 LAB — LIPASE, BLOOD: Lipase: 29 U/L (ref 11–51)

## 2018-12-02 MED ORDER — FENTANYL CITRATE (PF) 100 MCG/2ML IJ SOLN
100.0000 ug | Freq: Once | INTRAMUSCULAR | Status: AC
  Administered 2018-12-02: 100 ug via INTRAVENOUS
  Filled 2018-12-02: qty 2

## 2018-12-02 MED ORDER — ONDANSETRON HCL 4 MG/2ML IJ SOLN
4.0000 mg | Freq: Once | INTRAMUSCULAR | Status: AC
  Administered 2018-12-02: 4 mg via INTRAVENOUS
  Filled 2018-12-02: qty 2

## 2018-12-02 MED ORDER — SODIUM CHLORIDE 0.9% FLUSH
3.0000 mL | Freq: Once | INTRAVENOUS | Status: AC
  Administered 2018-12-02: 09:00:00 3 mL via INTRAVENOUS

## 2018-12-02 NOTE — ED Provider Notes (Signed)
[facility]-EMERGENCY DEPT Provider Note   CSN: [id number] Arrival date & time: 12/02/18  [id number]     History   Chief Complaint Chief Complaint  Patient presents with  . Abdominal Pain  . Emesis    HPI Ian Ortega is a 38 y.o. male.     HPI Patient woke with right-sided flank/abdominal pain this morning.  States he had the pain urinated and the pain may felt little worse.  It is constant.  Is dull.  May have a slightly decreased appetite.  No fevers or chills.  No diarrhea or constipation.  Has not had pains like this before.  No sick contacts.  Feels if he vomited due to the pain.  Has not eaten breakfast today.  No rash.  No trauma. History reviewed. No pertinent past medical history.  Patient Active Problem List   Diagnosis Date Noted  . Visit for well man health check 05/25/2017  . HLD (hyperlipidemia) 04/27/2013  . OSA (obstructive sleep apnea) 05/27/2012    History reviewed. No pertinent surgical history.      Home Medications    Prior to Admission medications   Medication Sig Start Date End Date Taking? Authorizing Provider  ibuprofen (ADVIL) 200 MG tablet Take 400 mg by mouth every 6 (six) hours as needed for fever, headache or moderate pain.   Yes [provider]  doxycycline (VIBRAMYCIN) 100 MG capsule Take 1 capsule (100 mg total) by mouth 2 (two) times daily. Patient not taking: Reported on 05/25/2017 06/07/16   Lutricia Feiloemer, William P, PA-C  mupirocin ointment (BACTROBAN) 2 % Apply 1 application topically 3 (three) times daily. Patient not taking: Reported on 05/25/2017 06/07/16   Lutricia Feiloemer, William P, PA-C  oxyCODONE-acetaminophen (ROXICET) 5-325 MG tablet Take 1 tablet by mouth every 8 (eight) hours as needed for moderate pain or severe pain (Do not drive or operate heavy machinery while taking as can cause drowsiness.). Patient not taking: Reported on 05/25/2017 06/08/16   Renford DillsMiller, Lindsey, NP  sulfamethoxazole-trimethoprim (BACTRIM  DS,SEPTRA DS) 800-160 MG tablet Take 1 tablet by mouth 2 (two) times daily. Patient not taking: Reported on 05/25/2017 06/05/16   Nche, Bonna Gainsharlotte Lum, NP    Family History Family History  Problem Relation Age of Onset  . Healthy Mother   . Healthy Father     Social History Social History   Tobacco Use  . Smoking status: Never Smoker  . Smokeless tobacco: Never Used  Substance Use Topics  . Alcohol use: Yes    Comment: occasional  . Drug use: No     Allergies   Patient has no known allergies.   Review of Systems Review of Systems  Constitutional: Positive for appetite change.  HENT: Negative for congestion.   Respiratory: Negative for cough and shortness of breath.   Cardiovascular: Negative for chest pain.  Gastrointestinal: Positive for abdominal pain, nausea and vomiting.  Genitourinary: Positive for flank pain.  Musculoskeletal: Negative for back pain.  Neurological: Negative for weakness.  Psychiatric/Behavioral: Negative for confusion.     Physical Exam Updated Vital Signs BP (!) 145/92 (BP Location: Left Arm)   Pulse (!) 56   Temp 97.7 F (36.5 C) (Oral)   Resp 14   Ht 6\' 4"  (1.93 m)   Wt 108.9 kg   SpO2 100%   BMI 29.21 kg/m   Physical Exam Vitals signs and nursing note reviewed.  HENT:     Head: Normocephalic.  Cardiovascular:     Rate and Rhythm: Normal  rate and regular rhythm.  Pulmonary:     Breath sounds: Normal breath sounds.  Abdominal:     Hernia: No hernia is present.     Comments: Right lateral flank tenderness.  Skin:    General: Skin is warm.     Capillary Refill: Capillary refill takes less than 2 seconds.  Neurological:     Mental Status: He is alert and oriented to person, place, and time.      ED Treatments / Results  Labs (all labs ordered are listed, but only abnormal results are displayed) Labs Reviewed  COMPREHENSIVE METABOLIC PANEL - Abnormal; Notable for the following components:      Result Value   Potassium  5.6 (*)    Glucose, Bld 123 (*)    Creatinine, Ser 1.30 (*)    All other components within normal limits  CBC - Abnormal; Notable for the following components:   WBC 13.0 (*)    All other components within normal limits  URINALYSIS, ROUTINE W REFLEX MICROSCOPIC - Abnormal; Notable for the following components:   Hgb urine dipstick MODERATE (*)    Leukocytes,Ua SMALL (*)    Bacteria, UA FEW (*)    All other components within normal limits  LIPASE, BLOOD    EKG None  Radiology Ct Renal Stone Study  Result Date: 12/02/2018 CLINICAL DATA:  Right-sided abdominal pain and vomiting beginning at 0300 hours. EXAM: CT ABDOMEN AND PELVIS WITHOUT CONTRAST TECHNIQUE: Multidetector CT imaging of the abdomen and pelvis was performed following the standard protocol without IV contrast. COMPARISON:  None. FINDINGS: Lower chest: Mild atelectasis at the lung bases. Hepatobiliary: Normal Pancreas: Normal Spleen: Normal Adrenals/Urinary Tract: Adrenal glands are normal. Left kidney is normal. Right kidney shows mild edema and surrounding edema in the fat. Mild fullness of the renal collecting system and ureter. 2 mm stone dependent within the bladder, having passed into the bladder. Stomach/Bowel: Normal Vascular/Lymphatic: Normal Reproductive: Normal Other: No free fluid or air. Musculoskeletal: Mild spinal curvature. Lower lumbar degenerative changes. IMPRESSION: 2 mm stone has passed into the bladder. Mild edema of the right kidney with mild perirenal edema and minimal fullness of the renal collecting system, all consistent with recently passed stone. No second stone identified. Mild atelectasis at the lung bases. Mild spinal curvature and lower lumbar degenerative changes. Electronically Signed   By: Paulina Fusi M.D.   On: 12/02/2018 11:24    Procedures Procedures (including critical care time)  Medications Ordered in ED Medications  sodium chloride flush (NS) 0.9 % injection 3 mL (3 mLs Intravenous  Given 12/02/18 0915)  ondansetron (ZOFRAN) injection 4 mg (4 mg Intravenous Given 12/02/18 0948)  fentaNYL (SUBLIMAZE) injection 100 mcg (100 mcg Intravenous Given 12/02/18 0952)     Initial Impression / Assessment and Plan / ED Course  I have reviewed the triage vital signs and the nursing notes.  Pertinent labs & imaging results that were available during my care of the patient were reviewed by me and considered in my medical decision making (see chart for details).       Patient presents with flank pain.  Found to have a right-sided kidney stone that had passed in the bladder already.  Mild renal insufficiency.  Mild hyperkalemia.  Blood likely will resolve now that he is passed the stone.  Did have some slight urinary retention while in the ER.  However still able to urinate.  Feels that he is urinating better now.  Will have follow-up with urology.  Follow-up  instructions given.  Final Clinical Impressions(s) / ED Diagnoses   Final diagnoses:  Kidney stone    ED Discharge Orders    None       Davonna Belling, MD 12/02/18 1344

## 2018-12-02 NOTE — ED Notes (Addendum)
Pt reports still having urge to void. Pt has had an output of 125cc. MD aware

## 2018-12-02 NOTE — ED Triage Notes (Signed)
Patient c/o right abdominal pain and emesis x 3 this AM.

## 2019-12-06 ENCOUNTER — Emergency Department
Admission: EM | Admit: 2019-12-06 | Discharge: 2019-12-06 | Disposition: A | Discharge status: Home / Self Care | Payer: BC Managed Care – PPO | Attending: Emergency Medicine | Admitting: Emergency Medicine

## 2019-12-06 ENCOUNTER — Emergency Department: Payer: BC Managed Care – PPO

## 2019-12-06 ENCOUNTER — Other Ambulatory Visit: Payer: Self-pay

## 2019-12-06 DIAGNOSIS — R52 Pain, unspecified: Secondary | ICD-10-CM

## 2019-12-06 DIAGNOSIS — M79672 Pain in left foot: Secondary | ICD-10-CM | POA: Insufficient documentation

## 2019-12-06 DIAGNOSIS — S59902A Unspecified injury of left elbow, initial encounter: Secondary | ICD-10-CM | POA: Diagnosis present

## 2019-12-06 DIAGNOSIS — S5002XA Contusion of left elbow, initial encounter: Secondary | ICD-10-CM | POA: Diagnosis not present

## 2019-12-06 DIAGNOSIS — M79642 Pain in left hand: Secondary | ICD-10-CM | POA: Insufficient documentation

## 2019-12-06 DIAGNOSIS — Y92410 Unspecified street and highway as the place of occurrence of the external cause: Secondary | ICD-10-CM | POA: Insufficient documentation
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
## 2023-10-15 ENCOUNTER — Ambulatory Visit: Payer: Self-pay | Admitting: Family
